# Patient Record
Sex: Female | Born: 1935 | Race: White | Marital: Single | Smoking: Never smoker
Health system: Southern US, Community
[De-identification: ages and names within clinical notes are randomized; demographics above are authoritative.]

## PROBLEM LIST (undated history)

## (undated) DIAGNOSIS — I10 Essential (primary) hypertension: Secondary | ICD-10-CM

## (undated) DIAGNOSIS — F0390 Unspecified dementia without behavioral disturbance: Secondary | ICD-10-CM

## (undated) DIAGNOSIS — Z9289 Personal history of other medical treatment: Secondary | ICD-10-CM

## (undated) DIAGNOSIS — E119 Type 2 diabetes mellitus without complications: Secondary | ICD-10-CM

## (undated) DIAGNOSIS — I482 Chronic atrial fibrillation, unspecified: Secondary | ICD-10-CM

## (undated) DIAGNOSIS — M1990 Unspecified osteoarthritis, unspecified site: Secondary | ICD-10-CM

## (undated) HISTORY — PX: FRACTURE SURGERY: SHX138

## (undated) HISTORY — PX: CARDIAC CATHETERIZATION: SHX172

---

## 2011-04-16 ENCOUNTER — Emergency Department: Payer: Self-pay | Admitting: Unknown Physician Specialty

## 2012-09-13 ENCOUNTER — Inpatient Hospital Stay: Payer: Self-pay | Admitting: Orthopedic Surgery

## 2012-09-13 LAB — CK TOTAL AND CKMB (NOT AT ARMC)
CK, Total: 45 U/L (ref 21–215)
CK-MB: 0.7 ng/mL (ref 0.5–3.6)

## 2012-09-13 LAB — BASIC METABOLIC PANEL
BUN: 26 mg/dL — ABNORMAL HIGH (ref 7–18)
Chloride: 100 mmol/L (ref 98–107)
Co2: 30 mmol/L (ref 21–32)
Creatinine: 1.35 mg/dL — ABNORMAL HIGH (ref 0.60–1.30)
EGFR (African American): 43 — ABNORMAL LOW
EGFR (Non-African Amer.): 38 — ABNORMAL LOW
Glucose: 98 mg/dL (ref 65–99)
Osmolality: 280 (ref 275–301)
Sodium: 138 mmol/L (ref 136–145)

## 2012-09-13 LAB — URINALYSIS, COMPLETE
Bacteria: NONE SEEN
Glucose,UR: NEGATIVE mg/dL (ref 0–75)
Ketone: NEGATIVE
Leukocyte Esterase: NEGATIVE
Nitrite: NEGATIVE
Ph: 6 (ref 4.5–8.0)
Protein: NEGATIVE
Specific Gravity: 1.01 (ref 1.003–1.030)

## 2012-09-13 LAB — PROTIME-INR
INR: 2.2
Prothrombin Time: 24.3 secs — ABNORMAL HIGH (ref 11.5–14.7)

## 2012-09-13 LAB — CBC WITH DIFFERENTIAL/PLATELET
Basophil #: 0.1 10*3/uL (ref 0.0–0.1)
Basophil %: 0.3 %
Eosinophil #: 0.1 10*3/uL (ref 0.0–0.7)
HCT: 35 % (ref 35.0–47.0)
HGB: 11.8 g/dL — ABNORMAL LOW (ref 12.0–16.0)
Lymphocyte #: 1 10*3/uL (ref 1.0–3.6)
MCH: 32.1 pg (ref 26.0–34.0)
MCHC: 33.7 g/dL (ref 32.0–36.0)
Monocyte #: 0.8 x10 3/mm (ref 0.2–0.9)
Monocyte %: 4.7 %
Neutrophil #: 15.1 10*3/uL — ABNORMAL HIGH (ref 1.4–6.5)
Platelet: 282 10*3/uL (ref 150–440)
RDW: 12.3 % (ref 11.5–14.5)

## 2012-09-13 LAB — APTT: Activated PTT: 37.2 secs — ABNORMAL HIGH (ref 23.6–35.9)

## 2012-09-13 LAB — MAGNESIUM: Magnesium: 1.7 mg/dL — ABNORMAL LOW

## 2012-09-14 DIAGNOSIS — I4891 Unspecified atrial fibrillation: Secondary | ICD-10-CM

## 2012-09-14 DIAGNOSIS — I059 Rheumatic mitral valve disease, unspecified: Secondary | ICD-10-CM

## 2012-09-14 LAB — PROTIME-INR: INR: 2.1

## 2012-09-15 DIAGNOSIS — I4891 Unspecified atrial fibrillation: Secondary | ICD-10-CM

## 2012-09-15 LAB — CBC WITH DIFFERENTIAL/PLATELET
Basophil #: 0 10*3/uL (ref 0.0–0.1)
Eosinophil #: 0.2 10*3/uL (ref 0.0–0.7)
Eosinophil %: 1.2 %
HGB: 10.7 g/dL — ABNORMAL LOW (ref 12.0–16.0)
Lymphocyte #: 1.4 10*3/uL (ref 1.0–3.6)
MCH: 32.9 pg (ref 26.0–34.0)
MCHC: 34.3 g/dL (ref 32.0–36.0)
MCV: 96 fL (ref 80–100)
Monocyte #: 1.6 x10 3/mm — ABNORMAL HIGH (ref 0.2–0.9)
Neutrophil %: 77.5 %
Platelet: 238 10*3/uL (ref 150–440)
RDW: 12.4 % (ref 11.5–14.5)

## 2012-09-15 LAB — BASIC METABOLIC PANEL
Anion Gap: 7 (ref 7–16)
Calcium, Total: 9.5 mg/dL (ref 8.5–10.1)
Co2: 26 mmol/L (ref 21–32)
Creatinine: 1.18 mg/dL (ref 0.60–1.30)
EGFR (African American): 51 — ABNORMAL LOW
Glucose: 127 mg/dL — ABNORMAL HIGH (ref 65–99)
Potassium: 4.5 mmol/L (ref 3.5–5.1)
Sodium: 138 mmol/L (ref 136–145)

## 2012-09-15 LAB — PROTIME-INR: INR: 1.2

## 2012-09-16 DIAGNOSIS — I4891 Unspecified atrial fibrillation: Secondary | ICD-10-CM

## 2012-09-16 LAB — BASIC METABOLIC PANEL
Anion Gap: 10 (ref 7–16)
Co2: 24 mmol/L (ref 21–32)
Creatinine: 1.07 mg/dL (ref 0.60–1.30)
EGFR (African American): 58 — ABNORMAL LOW
EGFR (Non-African Amer.): 50 — ABNORMAL LOW
Glucose: 96 mg/dL (ref 65–99)
Osmolality: 279 (ref 275–301)
Sodium: 138 mmol/L (ref 136–145)

## 2012-09-16 LAB — PLATELET COUNT: Platelet: 227 10*3/uL (ref 150–440)

## 2012-09-16 LAB — HEMOGLOBIN: HGB: 9.9 g/dL — ABNORMAL LOW (ref 12.0–16.0)

## 2012-09-17 LAB — URINALYSIS, COMPLETE
Glucose,UR: 50 mg/dL (ref 0–75)
Hyaline Cast: 1
Nitrite: NEGATIVE
Protein: 30
Specific Gravity: 1.014 (ref 1.003–1.030)
Transitional Epi: 1
WBC UR: 10 /HPF (ref 0–5)

## 2012-09-18 LAB — PROTIME-INR
INR: 1.6
Prothrombin Time: 19.1 secs — ABNORMAL HIGH (ref 11.5–14.7)

## 2012-09-19 DIAGNOSIS — I4891 Unspecified atrial fibrillation: Secondary | ICD-10-CM

## 2012-09-20 ENCOUNTER — Encounter: Payer: Self-pay | Admitting: Internal Medicine

## 2012-09-20 LAB — PROTIME-INR
INR: 3.7
Prothrombin Time: 36.7 secs — ABNORMAL HIGH (ref 11.5–14.7)

## 2012-09-22 LAB — PROTIME-INR: INR: 4.1

## 2012-09-23 ENCOUNTER — Emergency Department: Payer: Self-pay | Admitting: Emergency Medicine

## 2012-09-23 LAB — CBC
HCT: 30.1 % — ABNORMAL LOW (ref 35.0–47.0)
HGB: 10 g/dL — ABNORMAL LOW (ref 12.0–16.0)
MCHC: 33.4 g/dL (ref 32.0–36.0)
MCV: 97 fL (ref 80–100)
RBC: 3.11 10*6/uL — ABNORMAL LOW (ref 3.80–5.20)
RDW: 12.9 % (ref 11.5–14.5)
WBC: 15 10*3/uL — ABNORMAL HIGH (ref 3.6–11.0)

## 2012-09-23 LAB — DIGOXIN LEVEL: Digoxin: 1.7 ng/mL

## 2012-09-23 LAB — COMPREHENSIVE METABOLIC PANEL
Alkaline Phosphatase: 60 U/L (ref 50–136)
Anion Gap: 4 — ABNORMAL LOW (ref 7–16)
BUN: 20 mg/dL — ABNORMAL HIGH (ref 7–18)
Bilirubin,Total: 0.5 mg/dL (ref 0.2–1.0)
Calcium, Total: 9.1 mg/dL (ref 8.5–10.1)
Chloride: 106 mmol/L (ref 98–107)
Co2: 27 mmol/L (ref 21–32)
Creatinine: 1.12 mg/dL (ref 0.60–1.30)
EGFR (African American): 54 — ABNORMAL LOW
EGFR (Non-African Amer.): 47 — ABNORMAL LOW
Glucose: 80 mg/dL (ref 65–99)
Osmolality: 275 (ref 275–301)
Potassium: 4.7 mmol/L (ref 3.5–5.1)
SGOT(AST): 25 U/L (ref 15–37)
SGPT (ALT): 13 U/L (ref 12–78)
Sodium: 137 mmol/L (ref 136–145)
Total Protein: 6 g/dL — ABNORMAL LOW (ref 6.4–8.2)

## 2012-09-23 LAB — CK TOTAL AND CKMB (NOT AT ARMC): CK, Total: 28 U/L (ref 21–215)

## 2012-09-23 LAB — PROTIME-INR
INR: 4.2
Prothrombin Time: 40.6 secs — ABNORMAL HIGH (ref 11.5–14.7)

## 2012-09-23 LAB — TROPONIN I: Troponin-I: <0.02 ng/mL

## 2012-09-26 LAB — PROTIME-INR
INR: 2.7
Prothrombin Time: 29 secs — ABNORMAL HIGH (ref 11.5–14.7)

## 2012-09-29 LAB — PROTIME-INR: INR: 3.7

## 2012-10-03 LAB — PROTIME-INR: Prothrombin Time: 31.1 secs — ABNORMAL HIGH (ref 11.5–14.7)

## 2012-10-09 ENCOUNTER — Encounter: Payer: Self-pay | Admitting: Internal Medicine

## 2012-10-11 LAB — PROTIME-INR: INR: 5.7

## 2012-10-12 LAB — URINALYSIS, COMPLETE
Bacteria: NONE SEEN
Bilirubin,UR: NEGATIVE
Blood: NEGATIVE
Glucose,UR: 50 mg/dL (ref 0–75)
Leukocyte Esterase: NEGATIVE
Ph: 7 (ref 4.5–8.0)
Specific Gravity: 1.012 (ref 1.003–1.030)
Squamous Epithelial: <1

## 2012-10-13 ENCOUNTER — Emergency Department: Payer: Self-pay | Admitting: Internal Medicine

## 2012-10-13 ENCOUNTER — Ambulatory Visit: Payer: Self-pay

## 2012-10-13 LAB — PROTIME-INR
INR: 4.3
Prothrombin Time: 41.3 secs — ABNORMAL HIGH (ref 11.5–14.7)

## 2012-10-13 LAB — URINE CULTURE

## 2012-10-17 LAB — PROTIME-INR: INR: 1.8

## 2012-10-18 LAB — COMPREHENSIVE METABOLIC PANEL
Alkaline Phosphatase: 105 U/L (ref 50–136)
Bilirubin,Total: 0.6 mg/dL (ref 0.2–1.0)
Calcium, Total: 9.2 mg/dL (ref 8.5–10.1)
Chloride: 102 mmol/L (ref 98–107)
Co2: 30 mmol/L (ref 21–32)
EGFR (African American): >60
EGFR (Non-African Amer.): 57 — ABNORMAL LOW
Glucose: 114 mg/dL — ABNORMAL HIGH (ref 65–99)
Osmolality: 280 (ref 275–301)
SGOT(AST): 19 U/L (ref 15–37)
SGPT (ALT): 18 U/L (ref 12–78)
Total Protein: 6.2 g/dL — ABNORMAL LOW (ref 6.4–8.2)

## 2012-10-18 LAB — CBC WITH DIFFERENTIAL/PLATELET
Basophil #: 0 10*3/uL (ref 0.0–0.1)
Eosinophil #: 0.1 10*3/uL (ref 0.0–0.7)
HCT: 33.5 % — ABNORMAL LOW (ref 35.0–47.0)
Lymphocyte #: 2.1 10*3/uL (ref 1.0–3.6)
MCH: 32.3 pg (ref 26.0–34.0)
MCHC: 34.1 g/dL (ref 32.0–36.0)
MCV: 95 fL (ref 80–100)
Monocyte #: 1.2 x10 3/mm — ABNORMAL HIGH (ref 0.2–0.9)
Neutrophil #: 6.5 10*3/uL (ref 1.4–6.5)
RBC: 3.53 10*6/uL — ABNORMAL LOW (ref 3.80–5.20)
RDW: 13.7 % (ref 11.5–14.5)

## 2012-10-18 LAB — URIC ACID: Uric Acid: 3.6 mg/dL (ref 2.6–6.0)

## 2012-10-25 LAB — PROTIME-INR
INR: 1.5
Prothrombin Time: 18.2 secs — ABNORMAL HIGH (ref 11.5–14.7)

## 2012-11-01 ENCOUNTER — Emergency Department: Payer: Self-pay | Admitting: Emergency Medicine

## 2012-11-01 LAB — PROTIME-INR: INR: 1.9

## 2012-11-01 LAB — BASIC METABOLIC PANEL
Anion Gap: 6 — ABNORMAL LOW (ref 7–16)
BUN: 13 mg/dL (ref 7–18)
Calcium, Total: 9.5 mg/dL (ref 8.5–10.1)
Chloride: 103 mmol/L (ref 98–107)
Co2: 27 mmol/L (ref 21–32)
Creatinine: 0.75 mg/dL (ref 0.60–1.30)
EGFR (African American): >60
EGFR (Non-African Amer.): >60
Potassium: 4.4 mmol/L (ref 3.5–5.1)

## 2012-11-01 LAB — CK TOTAL AND CKMB (NOT AT ARMC): CK-MB: <0.5 ng/mL — ABNORMAL LOW (ref 0.5–3.6)

## 2012-11-01 LAB — CBC
MCV: 93 fL (ref 80–100)
Platelet: 347 10*3/uL (ref 150–440)
RDW: 13.9 % (ref 11.5–14.5)
WBC: 8.4 10*3/uL (ref 3.6–11.0)

## 2012-11-01 LAB — TROPONIN I
Troponin-I: <0.02 ng/mL
Troponin-I: <0.02 ng/mL

## 2012-11-03 ENCOUNTER — Telehealth: Payer: Self-pay

## 2012-11-03 NOTE — Telephone Encounter (Signed)
Per family member, pt not at home Lives at [facility]

## 2012-11-03 NOTE — Telephone Encounter (Signed)
"  call patient to check on her. Was in ER for atrial fib. New patient to [place]" VO Dr. Alvis Lemmings, RN

## 2012-11-04 NOTE — Telephone Encounter (Signed)
LMTCB at [facility]

## 2012-11-09 ENCOUNTER — Encounter: Payer: Self-pay | Admitting: Internal Medicine

## 2013-06-24 ENCOUNTER — Emergency Department: Payer: Self-pay | Admitting: Internal Medicine

## 2013-06-24 LAB — COMPREHENSIVE METABOLIC PANEL
Alkaline Phosphatase: 85 U/L (ref 50–136)
Bilirubin,Total: 0.4 mg/dL (ref 0.2–1.0)
Calcium, Total: 9.4 mg/dL (ref 8.5–10.1)
Chloride: 107 mmol/L (ref 98–107)
Co2: 26 mmol/L (ref 21–32)
EGFR (African American): >60
EGFR (Non-African Amer.): >60
SGPT (ALT): 18 U/L (ref 12–78)
Sodium: 138 mmol/L (ref 136–145)
Total Protein: 7 g/dL (ref 6.4–8.2)

## 2013-06-24 LAB — CBC
HCT: 38.3 % (ref 35.0–47.0)
MCH: 31.9 pg (ref 26.0–34.0)
MCHC: 34.7 g/dL (ref 32.0–36.0)
Platelet: 288 10*3/uL (ref 150–440)
RDW: 12.4 % (ref 11.5–14.5)

## 2013-08-10 IMAGING — CR DG CHEST 1V PORT
1 series · 1 of 1 positions shown · non-contrast
Comparison: none

REASON FOR EXAM: Chest Pain
COMMENTS:

PROCEDURE:     DXR - DXR PORTABLE CHEST SINGLE VIEW  - November 01, 2012  [DATE]
RESULT:     Comparison: 09/23/2012

[ap]
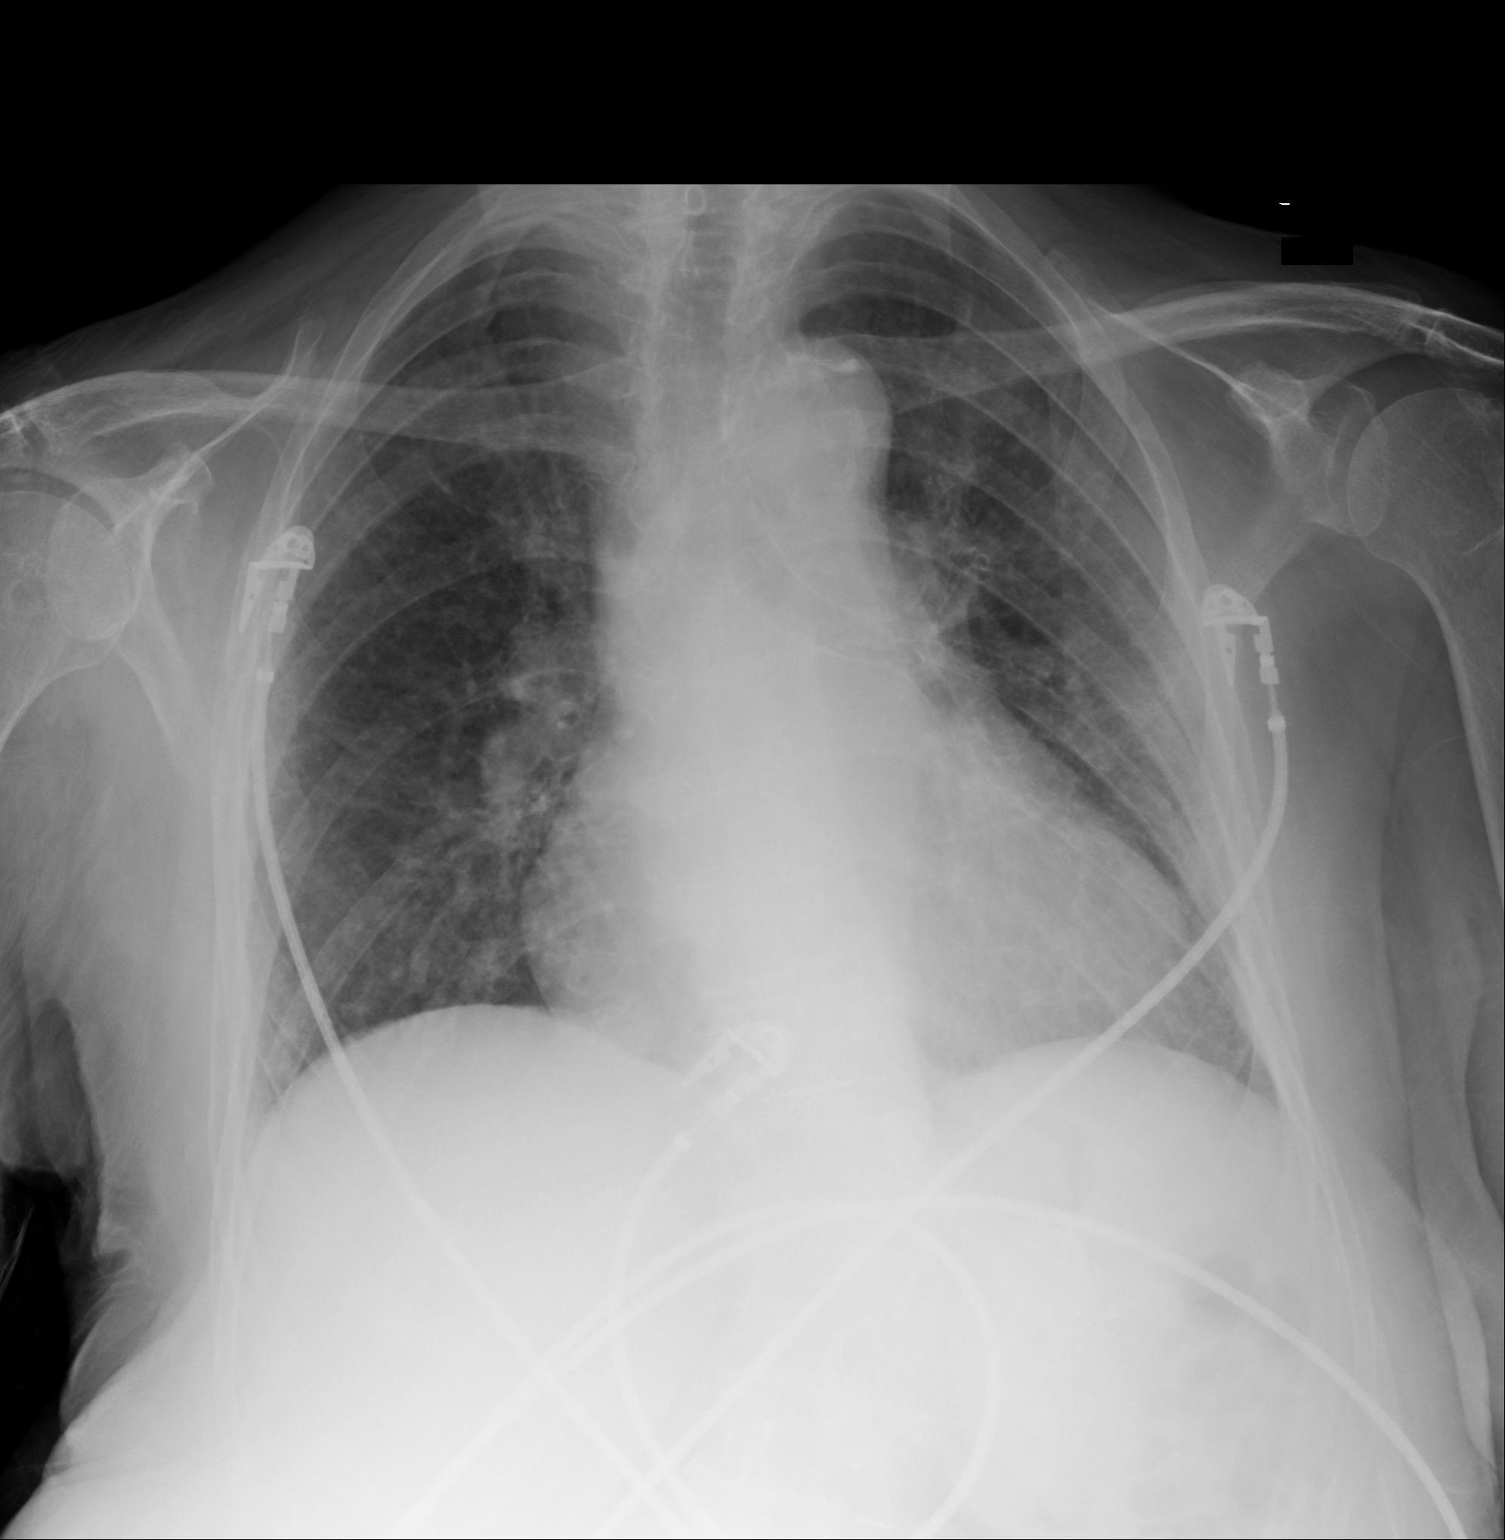

[1 of 1 positions shown; findings below may reference images not displayed]

FINDINGS: Cardiomegaly and the mediastinum are similar to prior. Mild hilar prominence
is likely related to prominent central pulmonary vasculature. There are
bilateral interstitial pulmonary opacities.
IMPRESSION: Mild interstitial opacities a may be secondary to interstitial pulmonary
edema. Atypical infection could have a similar appearance.

[REDACTED]

## 2015-02-26 NOTE — Consult Note (Signed)
General Aspect 81 yo woman with PMH of atrial fibrillation, on warfarin, presenting with fall and left hip fracture. In ER noted to have atrial fib with RVR. cardiology was consulted for preop eval and atrial fib with RVR.  She reports being in good health prior to her fall. She went to check her dog and turned to pivot and she fell. She felll on  her left hip. Following the fall, she presented to the ER.  She reports having atrial fibrillation for many years. She is seen in [place] for her medical and specialist care. Gait intability at baseline.    Present Illness . social: Lives with garndson, no smoking. unsteady gait at baseline  Family Hx: No CAD hx  by patients report   Physical Exam:   GEN well developed, well nourished, no acute distress, thin    HEENT red conjunctivae    NECK supple  No masses    RESP normal resp effort  clear BS    CARD Irregular rate and rhythm  Murmur    Murmur Systolic    ABD denies tenderness  soft    LYMPH negative neck    EXTR negative edema    SKIN normal to palpation    NEURO motor/sensory function intact    PSYCH alert, A+O to time, place, person, good insight   Review of Systems:   Subjective/Chief Complaint Left hip pain after fall     Left Hip Fracture:    Diabetes:    Hypertension:   Home Medications: Medication Instructions Status  polyethylene glycol 3350 oral powder for reconstitution 17 gram(s) (1 capful) mixed in 8 ounces of water, juice, or other liquid orally once a day as directed for constipation Active  tramadol 50 mg oral tablet 1 tab(s) orally every 4 hours for pain Active  warfarin 5 mg oral tablet 1 tab(s) orally once a day at 5 pm or as directed Active  enalapril 2.5 mg oral tablet 1 tab(s) orally once a day (in the morning) for blood pressure Active  metformin 500 mg oral tablet 1 tab(s) orally once a day for blood sugar Active  ferrous sulfate 325 mg (65 mg elemental iron) oral tablet 1 tab(s)  orally 3 times a day Active  calcium-vitamin D 600 mg-400 intl units oral tablet, chewable 1 tab(s) orally 2 times a day Active  Diltiazem Hydrochloride CD 240 mg/24 hours oral capsule, extended release 1 cap(s) orally once a day Active  citalopram 20 mg oral tablet 1 tab(s) orally once a day for depression Active  Vitamin B-12 1000 mcg oral tablet 1 tab(s) orally once a day Active  Vitamin D3 1000 intl units oral tablet 1 tab(s) orally once a day for supplement Active  simvastatin 20 mg oral tablet 1 tab(s) orally once a day (at bedtime) for cholesterol Active  gabapentin 300 mg oral capsule 1 cap(s) orally 3 times a day Active  furosemide 20 mg oral tablet 1 tab(s) orally once a day for edema/BP Active   Lab Results:  Thyroid:  05-Nov-13 15:35    Thyroid Stimulating Hormone 1.97 (0.45-4.50 (International Unit)  ----------------------- Pregnant patients have  different reference  ranges for TSH:  - - - - - - - - - -  Pregnant, first trimetser:  0.36 - 2.50 uIU/mL)  Routine BB:  05-Nov-13 15:35    ABO Group + Rh Type A Positive   Antibody Screen NEGATIVE (Result(s) reported on 13 Sep 2012 at 04:35PM.)  Routine Chem:  05-Nov-13  15:35    Magnesium, Serum  1.7 (1.8-2.4 THERAPEUTIC RANGE: 4-7 mg/dL TOXIC: > 10 mg/dL  -----------------------)   Glucose, Serum 98   BUN  26   Creatinine (comp)  1.35   Sodium, Serum 138   Potassium, Serum 3.9   Chloride, Serum 100   CO2, Serum 30   Calcium (Total), Serum  10.4   Anion Gap 8   Osmolality (calc) 280   eGFR (African American)  43   eGFR (Non-African American)  38 (eGFR values <37m/min/1.73 m2 may be an indication of chronic kidney disease (CKD). Calculated eGFR is useful in patients with stable renal function. The eGFR calculation will not be reliable in acutely ill patients when serum creatinine is changing rapidly. It is not useful in  patients on dialysis. The eGFR calculation may not be applicable to patients at the low and  high extremes of body sizes, pregnant women, and vegetarians.)  Cardiac:  05-Nov-13 15:35    Troponin I < 0.02 (0.00-0.05 0.05 ng/mL or less: NEGATIVE  Repeat testing in 3-6 hrs  if clinically indicated. >0.05 ng/mL: POTENTIAL  MYOCARDIAL INJURY. Repeat  testing in 3-6 hrs if  clinically indicated. NOTE: An increase or decrease  of 30% or more on serial  testing suggests a  clinically important change)   CK, Total 45   CPK-MB, Serum 0.7 (Result(s) reported on 13 Sep 2012 at 04:32PM.)  Routine UA:  05-Nov-13 15:35    Color (UA) Straw   Clarity (UA) Clear   Glucose (UA) Negative   Bilirubin (UA) Negative   Ketones (UA) Negative   Specific Gravity (UA) 1.010   Blood (UA) 1+   pH (UA) 6.0   Protein (UA) Negative   Nitrite (UA) Negative   Leukocyte Esterase (UA) Negative (Result(s) reported on 13 Sep 2012 at 04:33PM.)   RBC (UA) 1 /HPF   WBC (UA) 1 /HPF   Bacteria (UA) NONE SEEN   Epithelial Cells (UA) <1 /HPF   Hyaline Cast (UA) 3 /LPF (Result(s) reported on 13 Sep 2012 at 04:33PM.)  Routine Coag:  05-Nov-13 15:35    Prothrombin  24.3   INR 2.2 (INR reference interval applies to patients on anticoagulant therapy. A single INR therapeutic range for coumarins is not optimal for all indications; however, the suggested range for most indications is 2.0 - 3.0. Exceptions to the INR Reference Range may include: Prosthetic heart valves, acute myocardial infarction, prevention of myocardial infarction, and combinations of aspirin and anticoagulant. The need for a higher or lower target INR must be assessed individually. Reference: The Pharmacology and Management of the Vitamin K  antagonists: the seventh ACCP Conference on Antithrombotic and Thrombolytic Therapy. CNTZGY.1749Sept:126 (3suppl): [id number] A HCT value >55% may artifactually increase the PT.  In one study,  the increase was an average of 25%. Reference:  "Effect on Routine and Special Coagulation Testing  Values of Citrate Anticoagulant Adjustment in Patients with High HCT Values." American Journal of Clinical Pathology 2006;126:400-405.)   Activated PTT (APTT)  37.2 (A HCT value >55% may artifactually increase the APTT. In one study, the increase was an average of 19%. Reference: "Effect on Routine and Special Coagulation Testing Values of Citrate Anticoagulant Adjustment in Patients with High HCT Values." American Journal of Clinical Pathology 2006;126:400-405.)  Routine Hem:  05-Nov-13 15:35    WBC (CBC)  17.0   RBC (CBC)  3.67   Hemoglobin (CBC)  11.8   Hematocrit (CBC) 35.0   Platelet Count (CBC) 282   MCV  95   MCH 32.1   MCHC 33.7   RDW 12.3   Neutrophil % 88.4   Lymphocyte % 5.9   Monocyte % 4.7   Eosinophil % 0.7   Basophil % 0.3   Neutrophil #  15.1   Lymphocyte # 1.0   Monocyte # 0.8   Eosinophil # 0.1   Basophil # 0.1 (Result(s) reported on 13 Sep 2012 at 04:07PM.)   EKG:   Interpretation EKG on arrival shows atrial fib with rate 138 bpm, poor R wave progression through anterior precordial leads, nonspecific ST ABN    Sulfa drugs: Itching  Vital Signs/Nurse's Notes: **Vital Signs.:   06-Nov-13 06:08   Vital Signs Type Q 4hr   Temperature Temperature (F) 97.9   Celsius 36.6   Temperature Source Oral   Pulse Pulse 77   Respirations Respirations 16   Systolic BP Systolic BP 740   Diastolic BP (mmHg) Diastolic BP (mmHg) 64   Mean BP 76   Pulse Ox % Pulse Ox % 99   Oxygen Delivery Room Air/ 21 %     Impression 81 yo woman with PMH of atrial fibrillation, on warfarin, presenting with fall and left hip fracture. In ER noted to have atrial fib with RVR. cardiology was consulted for preop eval and atrial fib with RVR.  1) Left hip fx: INR elevated >2, Vit K given today Ortho is recommending surgery tomorrow given bleeding risk. She appears comfortable --Acceptable risk for surgery. No known CAD by patients reports. No active angina.  Echo pending  2)  Atrial fibrillation: Chronic, on warfarin Elevated heart rate likely from trauma and pain,  rate improved overnight ---Would continue diltiazem 60 mg Q6 (or Q8 if BP drops) ---Will decrease IVF rate as she required lasix as an outpt for suspected chronic diastolic CHF (had LE edema in afternoons --warfarin on hold. --Following surgery, could use xarelto (or restart warfarin with lovenox bridge)  3)   Electronic Signatures: Ida Rogue (MD)  (Signed [id number] 08:27)  Authored: General Aspect/Present Illness, History and Physical Exam, Review of System, Past Medical History, Home Medications, Labs, EKG , Allergies, Vital Signs/Nurse's Notes, Impression/Plan   Last Updated: 06-Nov-13 08:27 by Ida Rogue (MD)

## 2015-02-26 NOTE — Consult Note (Signed)
PATIENT NAME:  Madison Montoya, Madison Montoya MR#:  [id number] DATE OF BIRTH:  Sep 29, 1932  DATE OF CONSULTATION:  09/13/2012  REFERRING PHYSICIAN:  Kennedy Bucker, MD CONSULTING PHYSICIAN:  Alexes Menchaca H. Allena Katz, MD  PRIMARY CARE PHYSICIAN: [facility].  REASON FOR CONSULTATION: Preop evaluation, atrial fibrillation with RVR, diabetes, and hypertension.   HISTORY OF PRESENT ILLNESS: The patient is a 81 year old white female who fell earlier today and sustained a left-sided hip fracture, is being admitted by Ortho and we are asked to see the patient preoperatively. The patient is a very poor historian. She was noted to be in atrial fibrillation with RVR here and did not recall if she had atrial fibrillation, but she is on Coumadin and then on further questioning she reports that she might have been told that she has atrial fibrillation. She reports that she was trying to go to her living room and fell. She is very vague about how she fell. She states that she did not pass out, but she states that she was walking and the next thing she knew she was on the floor. She does have a history of feeling dizzy but has not passed out. She denies any chest pain or shortness of breath, no dyspnea on exertion. She has been eating and drinking okay, denies any abdominal pain, nausea, vomiting, diarrhea, or any urinary symptoms.   PAST MEDICAL HISTORY:  1. Diabetes type 2.  2. Hypertension.  3. Hyperlipidemia.  4. Depression.  5. History of coronary artery disease with angioplasty.  6. History of atrial fibrillation.   PAST SURGICAL HISTORY: None.   ALLERGIES: Sulfa.   HOME MEDICATIONS:  1. Calcium plus vitamin D 1 tab p.o. twice a day. 2. Citalopram 20 mg daily.  3. Diltiazem 250 mg daily.  4. Enalapril 2.5 mg daily.  5. Iron sulfate 325 mg 1 tab p.o. three times daily. 6. Lasix 20 mg 1 tab p.o. daily.  7. Gabapentin 300 mg 1 tab p.o. three times daily. 8. Metformin 500 mg 1 tab p.o. daily.  9. MiraLax 17 grams  daily.  10. Simvastatin 20 mg at bedtime.  11. Tramadol 50 mg every four hours p.r.n. pain.  12. Vitamin B12 1000 mcg daily.  13. Vitamin D3 1000 international units daily.  14. Warfarin 5 mg 1 tab p.o. daily.   SOCIAL HISTORY: Does not smoke, does not drink; lives with her grandson.   REVIEW OF SYSTEMS: Very poor historian. CONSTITUTIONAL: Denies any fevers, fatigue, or weakness. Complains of pain in the left hip, but not significant. Denies any weight loss, weight gain. EYES: No blurred or double vision. No pain. No redness. ENT: Denies any tinnitus. No ear pain. No hearing loss. No seasonal or year-round allergies. No difficulty swallowing. RESPIRATORY: Denies any cough, wheezing, or hemoptysis. No chronic obstructive pulmonary disease or tuberculosis. CARDIOVASCULAR: Denies any chest pain, orthopnea, or edema. Has history of atrial fibrillation. Denies any syncope. GASTROINTESTINAL: Denies any nausea, vomiting, or diarrhea. No abdominal pain. No hematemesis. GU: Denies any dysuria, hematuria, renal calculus, or frequency. ENDOCRINE: Denies any polyuria, nocturia, or thyroid problems. HEME/LYMPH: Denies anemia, easy bruisability, or bleeding. SKIN: No acne, no rash, and no change in mole, hair, or skin. MUSCULOSKELETAL: Complains of pain in the left hip. NEUROLOGIC: Denies any CVA, transient ischemic attack, or seizures. PSYCHIATRIC: Denies any anxiety, insomnia, or ADD.   PHYSICAL EXAMINATION:  VITAL SIGNS: Temperature 98.6, pulse 118, respirations 22, blood pressure 148/89, and O2 95%.   GENERAL: The patient is a 81 year old white female  currently not in any acute distress.   HEENT: Head atraumatic, normocephalic. Pupils equally round and reactive to light and accommodation. There is no conjunctival pallor. No scleral icterus. Nasal exam shows no drainage or ulceration. Oropharynx is clear without any exudate.   NECK: No thyromegaly. No carotid bruits.   CARDIOVASCULAR: Irregular, irregular  rhythm. No murmurs, rubs, clicks, or gallops. PMI is not displaced.   PULMONARY: Lungs are clear to auscultation bilaterally without any rales, rhonchi, or wheezing.   ABDOMEN: Soft, nontender, and nondistended. Positive bowel sounds x4. There is no hepatosplenomegaly.   EXTREMITIES: No clubbing, cyanosis, or edema.   SKIN: No rash.   LYMPHATICS: No lymph nodes palpable.   VASCULAR: Good DP and PT pulses.   NEUROLOGICAL: Cranial nerves II through XII grossly intact. No focal deficits.   EVALUATIONS IN EMERGENCY DEPARTMENT: Glucose 98, BUN 26, creatinine 1.35, sodium 138, potassium 3.9, chloride 100, CO2 30, and calcium is elevated at 10.5. WBC count is 17.0, hemoglobin 11.8, and platelet count 282.   EKG showed atrial fibrillation with RVR. No ST-T wave changes.   ASSESSMENT AND PLAN: The patient is a 81 year old white female status post fall with left hip fracture.  1. Preop. The patient is currently in atrial fibrillation with RVR and will need heart rate controlled prior to surgery, also on Coumadin. INR is currently pending. Obviously her Coumadin will need to be held. We will see what her INR is. In light of the patient being a very poor historian and not aware of her atrial fibrillation, at this time we will get an echocardiogram of the heart, check a TSH. I will ask cardiology to come evaluate the patient preoperatively. We will also check TSH and magnesium. If her heart rate continues to be elevated (she is receiving one dose of Cardizem) and, if her heart does not come down, then might need Cardizem drip.  2. Diabetes. We will place her on sliding scale insulin for now, hold her glipizide.  3. Mildly elevated creatinine, possibly due to dehydration. We will give her IV fluids, follow her renal function.  4. Leukocytosis, likely stress reaction. Her urinalysis is currently pending. We will follow her urinalysis. Treated as needed. We will follow her CBC as well in the a.m.   5. Hyperlipidemia. Continue simvastatin.  6. Depression. Continue simvastatin.  7. Miscellaneous. Recommend incentive spirometry and deep vein thrombosis prophylaxis postoperative.  TIME SPENT:  35 minutes. ____________________________ Lacie ScottsShreyang H. Allena KatzPatel, MD shp:slb D: 09/13/2012 16:14:49 ET T: 09/14/2012 07:51:00 ET JOB#: [id number]  cc: Manie Bealer H. Allena KatzPatel, MD, <Dictator> Leitha SchullerMichael J. Menz, MD Charise CarwinSHREYANG H Azim Gillingham MD ELECTRONICALLY SIGNED 09/22/2012 13:28

## 2015-02-26 NOTE — Discharge Summary (Signed)
PATIENT NAME:  Madison Montoya, Madison MR#:  [id number] DATE OF BIRTH:  04/17/1932  DATE OF ADMISSION:  09/13/2012 DATE OF DISCHARGE:  09/19/2012  ADMITTING DIAGNOSIS: Left intertrochanteric hip fracture.   DISCHARGE DIAGNOSIS: Left intertrochanteric hip fracture.   PROCEDURE: ORIF of left intertrochanteric hip fracture.   SURGEON: Leitha SchullerMichael J. Menz, M.D.   ANESTHESIA: Spinal.   COMPLICATIONS: None.   SPECIMENS: None.   IMPLANTS: [employer]omet Affixus left 130 degrees, 360 x 11 mm Affixus rod with 100 mm lag screw.   HISTORY: Patient is a 81 year old female who has a history of poor ambulation. She had began using a cane at times, does not get out of her home much, and has been fearful of falls. She fell at home. She does not know exactly why. She has had some trouble with being unsteady. She was brought to the ER where she was found to have a left intertrochanteric hip fracture and she is being admitted for treatment of this.   PHYSICAL EXAMINATION: GENERAL: Slender white female who appears her stated age in mild distress. HEENT: Unremarkable. PULMONARY: Lungs are clear. CARDIOVASCULAR:  Heart has irregular rate and rhythm. ABDOMEN: Soft and nontender with positive bowel sounds. EXTREMITIES: The left lower extremity was shortened and externally rotated. She is able to flex and extend the toes with intact sensation. She does have a palpable pulse.   HOSPITAL COURSE: Patient was admitted to the hospital on 09/13/2012. She was brought from the ER to the orthopedic floor for a left intertrochanteric hip fracture. Patient was in atrial fibrillation and had a cardiology evaluate with echo. On 09/14/2012 due to INR elevation she was given vitamin K. She became tachycardic and again continued with atrial fibrillation and diltiazem was started. Patient also suffered some acute renal failure with elevated creatinine which was treated with gentle fluids. On 09/15/2012 heart rate improved with Cardizem, amiodarone and  digoxin as needed. She was cleared for surgery. She had surgery this day. She was then transferred back to the Critical Care Unit due to heart rate and low blood pressure with rapid atrial fibrillation. On 09/16/2012 patient was treated with  for low blood pressure and rapid atrial fibrillation. Cardizem was also restarted. She was also started to bridge from Lovenox to Coumadin. On 09/16/2012 patient also became stable. She was transferred back to the orthopedic floor. She remained stable. On 09/17/2012 atrial fibrillation rate was controlled with metoprolol only. Cardizem was discontinued and she was weaned off the neosynphrine. Patient remained rate controlled overnight. On 09/18/2012 Coumadin was resumed. INR was therapeutic. Patient continued to have slow progress throughout her stay. She was also found to have on 09/18/2012 some clue cells of the vaginal swab and a urinary tract infection. Patient was treated with metronidazole and Keflex for this. On 09/19/2012 patient was stable and ready for discharge to rehab.   DISCHARGE INSTRUCTIONS: Patient should wear TED hose thigh-high on bilateral lower extremities. Physical therapy should evaluate and treat for difficulty walking and she is left weight-bearing as tolerated. OT should evaluate and treat patient for muscle weakness and assistance with activities of daily living. She can resume a regular diet. On 09/29/2012 she should have staples removed. Incision should be cleaned with Betadine and benzoin and Steri-Strips should be applied. Should keep dressing clean and dry and change the dressing as needed. She should [facility] and her INR should be checked daily and she can resume Coumadin once INR below 3. Coumadin should be  held until INR below 3.   DISCHARGE MEDICATIONS:  1. Simvastatin 20 mg oral at bedtime.  2. Insulin SS NovoLog injection subcutaneous FSBS before meals and at bedtime. Give no  insulin if FSBS 0 to 150, 2 units if FSBS 151 to 200, 4 units if FSBS 201 to 250, 6 units if FSBS 251 to 300, 8 units if FSBS 301 to 350, 10 units if FSBS 351 to 400. Call M.D. if glucose greater than 400.  3. Diphenhydramine oral 25 mg oral every six hours p.r.n. itching or allergic reaction.  4. Hydroxyzine 25 mg oral q.i.d. p.r.n. itching.  5. Tramadol 50 mg oral every six hours p.r.n. pain. 6. Amiodarone 200 mg oral q.8 hours. 7. Gabapentin 300 mg oral t.i.d.  8. Senokot 50/8.6 mg tablet 1 tablet oral b.i.d.  9. Milk of magnesia 30 mL oral b.i.d. p.r.n. constipation.  10. Digoxin 0.125 mg oral daily. 11. Metronidazole 500 mg oral q.12 hours x7 days. 12. Keflex 500 mg oral q.12 hours x7 days.  13. Tylenol 650 mg oral every six hours p.r.n. pain or fever greater than 101.1.  14. Metoprolol tartrate tablet 50 mg oral q.12 hours, hold for SBP less than 110 or heart rate less than 60.  15. Coumadin 10 mg oral q. 5:00 p.m., hold until INR less than 3.  ____________________________ Evon Slack, PA-C tcg:cms D: 09/19/2012 12:35:58 ET T: 09/19/2012 13:34:12 ET JOB#: [id number]  cc: Evon Slack, PA-C, <Dictator> Evon Slack [place] ELECTRONICALLY SIGNED 09/20/2012 9:29

## 2015-02-26 NOTE — H&P (Signed)
PATIENT NAME:  Madison EdwardsSLAUGHTER, Moncia MR#:  [id number] DATE OF BIRTH:  1932-10-24  DATE OF ADMISSION:  09/13/2012  CHIEF COMPLAINT: Left hip pain.   HISTORY OF PRESENT ILLNESS: The patient is a 81 year old who has history of poor ambulation. She has been using a cane at times, does not get out of her home much, and has been fearful of falls. She fell at home. She does not know exactly why. She has had some trouble with being unsteady. She was brought to the Emergency Room where she was found to have a left intertrochanteric hip fracture and she is being admitted for treatment of this.   PAST MEDICAL HISTORY:  1. Atrial fibrillation. 2. Hypertension. 3. Non-insulin-dependent diabetes. 4. Depression.   SOCIAL HISTORY: She is a nondrinker, nonsmoker and lives at home but relies on her family to help her with some activities of daily living such as shopping.   ADMISSION MEDICATIONS: 1. Coumadin 5 mg a day.  2. Vitamin D3 1,000 units daily.  3. Vitamin B12 1000 mcg daily.  4. Tramadol one p.o. every 4 hours p.r.n. pain.  5. Simvastatin 20 mg at night. 6. Polyethylene glycol 17 grams daily for constipation.  7. Metformin 500 mg daily.  8. Gabapentin 300 mg three times daily. 9. Lasix 20 mg as needed for edema. 10. Enalapril 2.5 mg daily for hypertension. 11. Diltiazem CD 240 mg daily. 12. Iron supplementation daily.  13. Citalopram 20 mg daily.  14. Calcium with vitamin D twice a day.  REVIEW OF SYSTEMS: Positive just for the hip pain. She does not complain of having any other complaints at present. She denies loss of consciousness or other injury.   PHYSICAL EXAMINATION:   GENERAL: Slender, white female who appears her stated age in mild distress.   HEENT: Unremarkable.   PULMONARY: Lungs are clear.   CARDIOVASCULAR: Heart has irregular rate and rhythm.   ABDOMEN: Soft and nontender with positive bowel sounds.   EXTREMITIES: The left lower extremity is shortened and externally  rotated. She is able to flex and extend the toes with intact sensation. She does have a palpable pulse.  RADIOGRAPHS: X-rays reveal a minimally displaced intertrochanteric left hip fracture without comminution.   CLINICAL IMPRESSION: Left intertrochanteric hip fracture. The patient has significant cardiac issues. We will get preop medical evaluation, but with her Coumadin it may be several days before she can have her operative intervention. This was explained to her and her family and we will plan on surgery when appropriate medically with intramedullary device. I did discuss that she should be weight-bearing as tolerated postoperatively, but will probably require prolonged rehab stay, but the expectation is that she will be able to make it back home six weeks post discharge.  ____________________________ Leitha SchullerMichael J. Xzander Gilham, MD mjm:slb D: 09/14/2012 07:42:47 ET     T: 09/14/2012 08:01:30 ET        JOB#: [id number] cc: Leitha SchullerMichael J. Renee Beale, MD, <Dictator> Leitha SchullerMICHAEL J Obaloluwa Delatte MD ELECTRONICALLY SIGNED 09/14/2012 12:58

## 2015-02-26 NOTE — Op Note (Signed)
PATIENT NAME:  Madison EdwardsSLAUGHTER, Asra MR#:  [id number] DATE OF BIRTH:  08/11/1932  DATE OF PROCEDURE:  09/15/2012  PREOPERATIVE DIAGNOSIS: Left intertrochanteric hip fracture.   POSTOPERATIVE DIAGNOSIS: Left intertrochanteric hip fracture.   PROCEDURE: Open reduction and internal fixation of left intertrochanteric hip fracture.   SURGEON: Leitha SchullerMichael J. Ottis Vacha, MD  ANESTHESIA: Spinal.   DESCRIPTION OF PROCEDURE: The patient was brought to the Operating Room and after adequate anesthesia was obtained the left leg was placed in the traction boot, longitudinal traction applied. C-arm was brought in with the right leg in the well legholder and acceptable alignment was obtained. There was some shortening compared to initial ER film. After prepping and draping using the barrier drape method, appropriate patient identification and time out procedures were completed. A proximal skin incision was made and the subcutaneous tissue was spread with a hemostat. A guidewire was inserted into the greater trochanter, center position, and advanced, after checking on AP and lateral projections. Proximal reaming was carried out and a guidewire was then inserted down the canal with measurements taken off of this. Reaming was carried out with a 13 mm flexible reamer, over the guidewire. The appropriate rod length was 11 x 360 mm, Affixus left rod 11 mm diameter, 130 degrees. After this was impacted to the appropriate depth, with the alignment guide, a small lateral incision was made and subcutaneous tissue was spread. A guidewire was inserted into near center, center position slightly anterior on the lateral view, for the lag screw. This measured 100 mm. This was drilled and then a 100 mm lag screw inserted. The set screw was then tightened and released a quarter turn to allow for compression with weight-bearing. Permanent C-arm views were obtained after removal of the assembly handle. The wounds were irrigated. The deep fascia was  closed using a #1 Vicryl, #2-0 Vicryl subcutaneously, and skin staples. Xeroform, 4 x 4's, ABD, and tape were applied. The patient was sent to the recovery room in stable condition.   ESTIMATED BLOOD LOSS: 100 mL.   COMPLICATIONS: None.   SPECIMEN: None.   IMPLANT: Biomet Affixus left 130 degree, 360 x 11 mm Affixus rod with 100 mm lag screw.  ____________________________ Leitha SchullerMichael J. Benjamim Harnish, MD mjm:slb D: 09/15/2012 22:06:56 ET T: 09/16/2012 10:20:45 ET JOB#: [id number]  cc: Leitha SchullerMichael J. Nayomi Tabron, MD, <Dictator> Leitha SchullerMICHAEL J Valori Hollenkamp MD ELECTRONICALLY SIGNED 09/16/2012 12:32

## 2015-02-26 NOTE — Op Note (Signed)
PATIENT NAME:  Madison EdwardsSLAUGHTER, Myka MR#:  [id number] DATE OF BIRTH:  [birth year]  DATE OF PROCEDURE:  09/15/2012    ____________________________ Leitha SchullerMichael J. Kaleeyah Cuffie, MD mjm:cms D: 09/15/2012 22:00:27 ET T: 09/16/2012 09:02:29 ET JOB#: [id number]  cc:

## 2015-03-16 ENCOUNTER — Encounter: Payer: Self-pay | Admitting: Emergency Medicine

## 2015-03-16 ENCOUNTER — Emergency Department: Payer: Medicare Other

## 2015-03-16 ENCOUNTER — Other Ambulatory Visit: Payer: Self-pay

## 2015-03-16 ENCOUNTER — Inpatient Hospital Stay
Admission: EM | Admit: 2015-03-16 | Discharge: 2015-03-25 | DRG: 308 | Disposition: A | Discharge status: Skilled Nursing Facility | Payer: Medicare Other | Attending: Internal Medicine | Admitting: Internal Medicine

## 2015-03-16 DIAGNOSIS — E119 Type 2 diabetes mellitus without complications: Secondary | ICD-10-CM | POA: Diagnosis present

## 2015-03-16 DIAGNOSIS — F0391 Unspecified dementia with behavioral disturbance: Secondary | ICD-10-CM | POA: Diagnosis present

## 2015-03-16 DIAGNOSIS — K5791 Diverticulosis of intestine, part unspecified, without perforation or abscess with bleeding: Secondary | ICD-10-CM | POA: Diagnosis present

## 2015-03-16 DIAGNOSIS — N390 Urinary tract infection, site not specified: Secondary | ICD-10-CM | POA: Diagnosis present

## 2015-03-16 DIAGNOSIS — K922 Gastrointestinal hemorrhage, unspecified: Secondary | ICD-10-CM | POA: Diagnosis not present

## 2015-03-16 DIAGNOSIS — D72829 Elevated white blood cell count, unspecified: Secondary | ICD-10-CM | POA: Diagnosis present

## 2015-03-16 DIAGNOSIS — Z792 Long term (current) use of antibiotics: Secondary | ICD-10-CM | POA: Diagnosis not present

## 2015-03-16 DIAGNOSIS — D62 Acute posthemorrhagic anemia: Secondary | ICD-10-CM | POA: Diagnosis present

## 2015-03-16 DIAGNOSIS — I4819 Other persistent atrial fibrillation: Secondary | ICD-10-CM

## 2015-03-16 DIAGNOSIS — I10 Essential (primary) hypertension: Secondary | ICD-10-CM | POA: Diagnosis present

## 2015-03-16 DIAGNOSIS — M1990 Unspecified osteoarthritis, unspecified site: Secondary | ICD-10-CM | POA: Diagnosis present

## 2015-03-16 DIAGNOSIS — R079 Chest pain, unspecified: Secondary | ICD-10-CM | POA: Diagnosis present

## 2015-03-16 DIAGNOSIS — Z7901 Long term (current) use of anticoagulants: Secondary | ICD-10-CM | POA: Diagnosis not present

## 2015-03-16 DIAGNOSIS — R58 Hemorrhage, not elsewhere classified: Secondary | ICD-10-CM

## 2015-03-16 DIAGNOSIS — D500 Iron deficiency anemia secondary to blood loss (chronic): Secondary | ICD-10-CM | POA: Diagnosis not present

## 2015-03-16 DIAGNOSIS — I481 Persistent atrial fibrillation: Principal | ICD-10-CM | POA: Diagnosis present

## 2015-03-16 DIAGNOSIS — Z7982 Long term (current) use of aspirin: Secondary | ICD-10-CM

## 2015-03-16 DIAGNOSIS — I4891 Unspecified atrial fibrillation: Secondary | ICD-10-CM | POA: Diagnosis not present

## 2015-03-16 DIAGNOSIS — R0602 Shortness of breath: Secondary | ICD-10-CM | POA: Diagnosis present

## 2015-03-16 DIAGNOSIS — I959 Hypotension, unspecified: Secondary | ICD-10-CM | POA: Diagnosis not present

## 2015-03-16 DIAGNOSIS — Z515 Encounter for palliative care: Secondary | ICD-10-CM | POA: Diagnosis not present

## 2015-03-16 DIAGNOSIS — Z79899 Other long term (current) drug therapy: Secondary | ICD-10-CM

## 2015-03-16 DIAGNOSIS — I482 Chronic atrial fibrillation: Secondary | ICD-10-CM | POA: Diagnosis not present

## 2015-03-16 DIAGNOSIS — R4182 Altered mental status, unspecified: Secondary | ICD-10-CM | POA: Diagnosis not present

## 2015-03-16 HISTORY — DX: Essential (primary) hypertension: I10

## 2015-03-16 HISTORY — DX: Unspecified osteoarthritis, unspecified site: M19.90

## 2015-03-16 HISTORY — DX: Personal history of other medical treatment: Z92.89

## 2015-03-16 HISTORY — DX: Unspecified dementia, unspecified severity, without behavioral disturbance, psychotic disturbance, mood disturbance, and anxiety: F03.90

## 2015-03-16 HISTORY — DX: Type 2 diabetes mellitus without complications: E11.9

## 2015-03-16 LAB — CBC
HEMATOCRIT: 27.9 % — AB (ref 35.0–47.0)
Hemoglobin: 9.6 g/dL — ABNORMAL LOW (ref 12.0–16.0)
MCH: 32.6 pg (ref 26.0–34.0)
MCHC: 34.3 g/dL (ref 32.0–36.0)
MCV: 95.1 fL (ref 80.0–100.0)
Platelets: 289 10*3/uL (ref 150–440)
RBC: 2.93 MIL/uL — ABNORMAL LOW (ref 3.80–5.20)
RDW: 12.9 % (ref 11.5–14.5)
WBC: 9.8 10*3/uL (ref 3.6–11.0)

## 2015-03-16 LAB — COMPREHENSIVE METABOLIC PANEL
ALK PHOS: 42 U/L (ref 38–126)
ALT: 15 U/L (ref 14–54)
AST: 31 U/L (ref 15–41)
Albumin: 3.9 g/dL (ref 3.5–5.0)
Anion gap: 11 (ref 5–15)
BILIRUBIN TOTAL: 0.7 mg/dL (ref 0.3–1.2)
BUN: 21 mg/dL — ABNORMAL HIGH (ref 6–20)
CALCIUM: 10.1 mg/dL (ref 8.9–10.3)
CO2: 21 mmol/L — ABNORMAL LOW (ref 22–32)
Chloride: 106 mmol/L (ref 101–111)
Creatinine, Ser: 1.09 mg/dL — ABNORMAL HIGH (ref 0.44–1.00)
GFR calc Af Amer: 54 mL/min — ABNORMAL LOW (ref >60)
GFR, EST NON AFRICAN AMERICAN: 46 mL/min — AB (ref >60)
Glucose, Bld: 150 mg/dL — ABNORMAL HIGH (ref 65–99)
Potassium: 3.7 mmol/L (ref 3.5–5.1)
Sodium: 138 mmol/L (ref 135–145)
TOTAL PROTEIN: 6.5 g/dL (ref 6.5–8.1)

## 2015-03-16 LAB — CBC WITH DIFFERENTIAL/PLATELET
Basophils Absolute: 0 10*3/uL (ref 0–0.1)
Basophils Relative: 0 %
Eosinophils Absolute: 0.1 10*3/uL (ref 0–0.7)
Eosinophils Relative: 1 %
HEMATOCRIT: 33.4 % — AB (ref 35.0–47.0)
Hemoglobin: 11 g/dL — ABNORMAL LOW (ref 12.0–16.0)
Lymphocytes Relative: 22 %
Lymphs Abs: 2.6 10*3/uL (ref 1.0–3.6)
MCH: 31.6 pg (ref 26.0–34.0)
MCHC: 32.9 g/dL (ref 32.0–36.0)
MCV: 96.1 fL (ref 80.0–100.0)
MONO ABS: 1 10*3/uL — AB (ref 0.2–0.9)
Monocytes Relative: 9 %
NEUTROS ABS: 8.2 10*3/uL — AB (ref 1.4–6.5)
Neutrophils Relative %: 68 %
Platelets: 332 10*3/uL (ref 150–440)
RBC: 3.47 MIL/uL — ABNORMAL LOW (ref 3.80–5.20)
RDW: 13 % (ref 11.5–14.5)
WBC: 12 10*3/uL — ABNORMAL HIGH (ref 3.6–11.0)

## 2015-03-16 LAB — URINALYSIS COMPLETE WITH MICROSCOPIC (ARMC ONLY)
BILIRUBIN URINE: NEGATIVE
GLUCOSE, UA: NEGATIVE mg/dL
Ketones, ur: NEGATIVE mg/dL
Nitrite: NEGATIVE
Protein, ur: NEGATIVE mg/dL
SPECIFIC GRAVITY, URINE: 1.005 (ref 1.005–1.030)
pH: 7 (ref 5.0–8.0)

## 2015-03-16 LAB — PROTIME-INR
INR: 2.13
PROTHROMBIN TIME: 24 s — AB (ref 11.4–15.0)

## 2015-03-16 LAB — GLUCOSE, CAPILLARY
GLUCOSE-CAPILLARY: 120 mg/dL — AB (ref 70–99)
GLUCOSE-CAPILLARY: 112 mg/dL — AB (ref 70–99)

## 2015-03-16 LAB — TSH: TSH: 1.408 u[IU]/mL (ref 0.350–4.500)

## 2015-03-16 LAB — CREATININE, SERUM
Creatinine, Ser: 0.95 mg/dL (ref 0.44–1.00)
GFR calc non Af Amer: 55 mL/min — ABNORMAL LOW (ref >60)

## 2015-03-16 LAB — TROPONIN I
Troponin I: 0.04 ng/mL — ABNORMAL HIGH (ref <0.031)
Troponin I: 0.04 ng/mL — ABNORMAL HIGH (ref <0.031)

## 2015-03-16 LAB — MAGNESIUM: Magnesium: 1.7 mg/dL (ref 1.7–2.4)

## 2015-03-16 MED ORDER — ONDANSETRON HCL 4 MG PO TABS
4.0000 mg | ORAL_TABLET | Freq: Four times a day (QID) | ORAL | Status: DC | PRN
  Administered 2015-03-17: 4 mg via ORAL
  Filled 2015-03-16: qty 1

## 2015-03-16 MED ORDER — DILTIAZEM HCL 25 MG/5ML IV SOLN
5.0000 mg | Freq: Once | INTRAVENOUS | Status: AC
Start: 2015-03-16 — End: 2015-03-16
  Administered 2015-03-16: 5 mg via INTRAVENOUS

## 2015-03-16 MED ORDER — IOHEXOL 300 MG/ML  SOLN
80.0000 mL | Freq: Once | INTRAMUSCULAR | Status: AC | PRN
  Administered 2015-03-16: 80 mL via INTRAVENOUS

## 2015-03-16 MED ORDER — ACETAMINOPHEN 325 MG PO TABS
650.0000 mg | ORAL_TABLET | Freq: Four times a day (QID) | ORAL | Status: DC | PRN
  Administered 2015-03-17 – 2015-03-22 (×9): 650 mg via ORAL
  Filled 2015-03-16 (×10): qty 2

## 2015-03-16 MED ORDER — METOPROLOL TARTRATE 25 MG PO TABS
25.0000 mg | ORAL_TABLET | Freq: Two times a day (BID) | ORAL | Status: DC
  Administered 2015-03-16 – 2015-03-17 (×2): 25 mg via ORAL
  Filled 2015-03-16 (×3): qty 1

## 2015-03-16 MED ORDER — DILTIAZEM HCL 25 MG/5ML IV SOLN: 5.0000 mg | Freq: Once | INTRAVENOUS | Status: DC

## 2015-03-16 MED ORDER — ONDANSETRON HCL 4 MG/2ML IJ SOLN
4.0000 mg | Freq: Once | INTRAMUSCULAR | Status: AC
  Administered 2015-03-16: 4 mg via INTRAVENOUS

## 2015-03-16 MED ORDER — IOHEXOL 240 MG/ML SOLN
25.0000 mL | Freq: Once | INTRAMUSCULAR | Status: AC | PRN
  Administered 2015-03-16: 25 mL via INTRAVENOUS

## 2015-03-16 MED ORDER — DILTIAZEM HCL 100 MG IV SOLR
5.0000 mg/h | INTRAVENOUS | Status: DC
  Administered 2015-03-16 – 2015-03-17 (×2): 5 mg/h via INTRAVENOUS
  Filled 2015-03-16 (×3): qty 100

## 2015-03-16 MED ORDER — ACETAMINOPHEN 650 MG RE SUPP: 650.0000 mg | Freq: Four times a day (QID) | RECTAL | Status: DC | PRN

## 2015-03-16 MED ORDER — INSULIN ASPART 100 UNIT/ML ~~LOC~~ SOLN: 0.0000 [IU] | Freq: Three times a day (TID) | SUBCUTANEOUS | Status: DC

## 2015-03-16 MED ORDER — DILTIAZEM HCL 25 MG/5ML IV SOLN
INTRAVENOUS | Status: AC
  Filled 2015-03-16: qty 5

## 2015-03-16 MED ORDER — LORAZEPAM 2 MG/ML IJ SOLN
1.0000 mg | Freq: Once | INTRAMUSCULAR | Status: AC
  Administered 2015-03-16: 0.5 mg via INTRAVENOUS

## 2015-03-16 MED ORDER — CIPROFLOXACIN HCL 500 MG PO TABS
ORAL_TABLET | ORAL | Status: AC
  Filled 2015-03-16: qty 2

## 2015-03-16 MED ORDER — CALCIUM CARBONATE-VITAMIN D 500-200 MG-UNIT PO TABS
1.0000 | ORAL_TABLET | ORAL | Status: DC
  Administered 2015-03-17 – 2015-03-25 (×8): 1 via ORAL
  Filled 2015-03-16 (×8): qty 1

## 2015-03-16 MED ORDER — WARFARIN SODIUM 1 MG PO TABS
2.5000 mg | ORAL_TABLET | ORAL | Status: DC
  Administered 2015-03-17: 2.5 mg via ORAL
  Filled 2015-03-16: qty 3

## 2015-03-16 MED ORDER — DILTIAZEM HCL 25 MG/5ML IV SOLN
5.0000 mg | Freq: Once | INTRAVENOUS | Status: AC
  Administered 2015-03-16: 5 mg via INTRAVENOUS

## 2015-03-16 MED ORDER — INSULIN ASPART 100 UNIT/ML ~~LOC~~ SOLN
0.0000 [IU] | Freq: Three times a day (TID) | SUBCUTANEOUS | Status: DC
  Administered 2015-03-17: 2 [IU] via SUBCUTANEOUS
  Administered 2015-03-17 – 2015-03-21 (×10): 1 [IU] via SUBCUTANEOUS
  Administered 2015-03-21 – 2015-03-22 (×2): 2 [IU] via SUBCUTANEOUS
  Administered 2015-03-23: 1 [IU] via SUBCUTANEOUS
  Administered 2015-03-23 – 2015-03-24 (×2): 2 [IU] via SUBCUTANEOUS
  Administered 2015-03-24: 1 [IU] via SUBCUTANEOUS
  Administered 2015-03-25: 2 [IU] via SUBCUTANEOUS
  Administered 2015-03-25: 1 [IU] via SUBCUTANEOUS
  Filled 2015-03-16: qty 2
  Filled 2015-03-16 (×3): qty 1
  Filled 2015-03-16: qty 2
  Filled 2015-03-16 (×9): qty 1
  Filled 2015-03-16 (×4): qty 2
  Filled 2015-03-16: qty 1

## 2015-03-16 MED ORDER — ONDANSETRON HCL 4 MG/2ML IJ SOLN
INTRAMUSCULAR | Status: AC
  Filled 2015-03-16: qty 2

## 2015-03-16 MED ORDER — SIMVASTATIN 20 MG PO TABS
20.0000 mg | ORAL_TABLET | Freq: Every evening | ORAL | Status: DC
  Administered 2015-03-16 – 2015-03-24 (×7): 20 mg via ORAL
  Filled 2015-03-16 (×7): qty 1

## 2015-03-16 MED ORDER — ALBUTEROL SULFATE (2.5 MG/3ML) 0.083% IN NEBU: 2.5000 mg | INHALATION_SOLUTION | RESPIRATORY_TRACT | Status: DC | PRN

## 2015-03-16 MED ORDER — SODIUM CHLORIDE 0.9 % IV BOLUS (SEPSIS)
1000.0000 mL | Freq: Once | INTRAVENOUS | Status: AC
  Administered 2015-03-16: 1000 mL via INTRAVENOUS

## 2015-03-16 MED ORDER — SODIUM CHLORIDE 0.9 % IJ SOLN: 3.0000 mL | INTRAMUSCULAR | Status: DC | PRN

## 2015-03-16 MED ORDER — SODIUM CHLORIDE 0.9 % IV SOLN: 250.0000 mL | INTRAVENOUS | Status: DC | PRN

## 2015-03-16 MED ORDER — ONDANSETRON HCL 4 MG/2ML IJ SOLN: 4.0000 mg | Freq: Four times a day (QID) | INTRAMUSCULAR | Status: DC | PRN

## 2015-03-16 MED ORDER — SODIUM CHLORIDE 0.9 % IJ SOLN
3.0000 mL | Freq: Two times a day (BID) | INTRAMUSCULAR | Status: DC
Start: 2015-03-16 — End: 2015-03-24
  Administered 2015-03-17 – 2015-03-23 (×11): 3 mL via INTRAVENOUS

## 2015-03-16 MED ORDER — CIPROFLOXACIN HCL 500 MG PO TABS: 500.0000 mg | ORAL_TABLET | Freq: Two times a day (BID) | ORAL | Status: DC

## 2015-03-16 MED ORDER — LORAZEPAM 2 MG/ML IJ SOLN
INTRAMUSCULAR | Status: AC
  Administered 2015-03-16: 0.5 mg via INTRAVENOUS
  Filled 2015-03-16: qty 1

## 2015-03-16 MED ORDER — LEVOFLOXACIN IN D5W 500 MG/100ML IV SOLN
500.0000 mg | INTRAVENOUS | Status: DC
  Administered 2015-03-16: 500 mg via INTRAVENOUS
  Filled 2015-03-16 (×2): qty 100

## 2015-03-16 MED ORDER — CIPROFLOXACIN HCL 500 MG PO TABS
750.0000 mg | ORAL_TABLET | Freq: Once | ORAL | Status: AC
  Administered 2015-03-16: 750 mg via ORAL

## 2015-03-16 MED ORDER — SODIUM CHLORIDE 0.9 % IV SOLN: Freq: Once | INTRAVENOUS | Status: DC

## 2015-03-16 MED ORDER — LEVOFLOXACIN IN D5W 500 MG/100ML IV SOLN
INTRAVENOUS | Status: AC
  Filled 2015-03-16: qty 100

## 2015-03-16 MED ORDER — WARFARIN - PHARMACIST DOSING INPATIENT: Freq: Every day | Status: DC

## 2015-03-16 MED ORDER — ASPIRIN EC 81 MG PO TBEC
81.0000 mg | DELAYED_RELEASE_TABLET | ORAL | Status: DC
Start: 2015-03-17 — End: 2015-03-17
  Administered 2015-03-17: 81 mg via ORAL
  Filled 2015-03-16: qty 1

## 2015-03-16 MED ORDER — ENALAPRIL MALEATE 5 MG PO TABS
5.0000 mg | ORAL_TABLET | Freq: Two times a day (BID) | ORAL | Status: DC
  Administered 2015-03-17 – 2015-03-25 (×15): 5 mg via ORAL
  Filled 2015-03-16 (×17): qty 1

## 2015-03-16 MED ORDER — WARFARIN SODIUM 1 MG PO TABS
5.0000 mg | ORAL_TABLET | ORAL | Status: DC
  Administered 2015-03-16: 5 mg via ORAL
  Filled 2015-03-16: qty 5

## 2015-03-16 NOTE — Progress Notes (Signed)
This patient has received 11 ml's of IV Omnipaque 300 (type of contrast) contrast extravasation into left arm (part of body) during a CT Abdomen exam.  The exam was performed on (date) 03/16/15  Site / affected area assessed by RN staff

## 2015-03-16 NOTE — ED Notes (Signed)
Daughter Darel HongJudy left number [phone]

## 2015-03-16 NOTE — Progress Notes (Signed)
Madison EdwardsMarie Montoya    [id number]   13-Jan-1932  Type of procedure: CT Abdomen  03/16/2015  [facility] some of the x-ray dye leaked into the tissues around the vein that the x-ray dye was given through.  What should you do?  1. Apply ice 20 minutes four times a day for three days.  2. Keep the affected extremity elevated above the rest of your body for 48 hours.  3. If you develop any of the following signs or symptoms, please contact Radiology at [phone].  Increased pain  Increasing swelling   Change in sensation (ex. Numbness, tingling)  Development of redness or increase in warmth around the affected area  Increasing hardness  Blistering   I have read and understand these instructions.  Any questions I raised have been discussed to my satisfaction.  I understand that failure to follow these instructions may result in additional complications.

## 2015-03-16 NOTE — ED Notes (Signed)
CRITICAL VALUE ALERT  Critical value received: 0.04  Date of notification:  05/07/206  Time of notification:  1405  Critical value read back:Yes.    Nurse who received alert:  Brieann Osinski,RN  MD notified (1st page): Dr Darnelle CatalanMalinda  Time of first page:  1405  MD notified (2nd page):  Time of second page:  Responding MD:  Darnelle CatalanMalinda  Time MD responded:  [phone]

## 2015-03-16 NOTE — H&P (Addendum)
[facility] [place] at [facility]   PATIENT NAME: Madison Montoya    MR#:  [id number]  DATE OF BIRTH:  Aug 24, 1932  DATE OF ADMISSION:  03/16/2015  PRIMARY CARE PHYSICIAN: [facility]   REQUESTING/REFERRING PHYSICIAN: Governor Rooks.  CHIEF COMPLAINT:   Chief Complaint  Patient presents with  . Chest Pain  . Shortness of Breath  . Altered Mental Status    HISTORY OF PRESENT ILLNESS:  Madison Montoya  is a 81 y.o. female with a known history of HTN, DM, Afib and dementia. The patient is a demented, and able to provide information. According to patient and daughter and the son, patient was noticed to be confused and has urine odor. In addition, patient had nausea and vomiting. The patient was sent to the ED for possible UTI. Patient was found to have a UTI and was treated with the Levaquin by mouth 1 dose. Patient was being to be discharged home. However, patient was found to have A. fib with RVR at 166. She was treated with a Cardizem IV push 3 doses, but the heart rate is a still fast at about 130s. Patient was started with a Cardizem drip. The patient has history of A. fib on Coumadin. EKG in the ED showed A. fib with RVR at 166.  PAST MEDICAL HISTORY:   Past Medical History  Diagnosis Date  . Hypertension   . Diabetes mellitus without complication   . Dementia   . Arthritis     PAST SURGICAL HISTORY:   Past Surgical History  Procedure Laterality Date  . Fracture surgery      SOCIAL HISTORY:   History  Substance Use Topics  . Smoking status: Never Smoker   . Smokeless tobacco: Not on file  . Alcohol Use: No    FAMILY HISTORY:  The patient is a demented and unable to get family history.  DRUG ALLERGIES:  No Known Allergies  REVIEW OF SYSTEMS:  The patient is a demented and confused, unable to provide any review of history and review of system. MEDICATIONS AT HOME:   Prior to Admission medications   Medication Sig Start  Date End Date Taking? Authorizing Provider  acetaminophen (TYLENOL) 500 MG tablet Take 500-1,000 mg by mouth 2 (two) times daily as needed for mild pain.    Yes Historical Provider, MD  aspirin EC 81 MG tablet Take 81 mg by mouth every morning.   Yes Historical Provider, MD  calcium-vitamin D (OSCAL WITH D) 500-200 MG-UNIT per tablet Take 1 tablet by mouth every morning.   Yes Historical Provider, MD  enalapril (VASOTEC) 5 MG tablet Take 5 mg by mouth 2 (two) times daily.   Yes Historical Provider, MD  metoprolol (LOPRESSOR) 50 MG tablet Take 25 mg by mouth 2 (two) times daily.   Yes Historical Provider, MD  simvastatin (ZOCOR) 20 MG tablet Take 20 mg by mouth every evening.   Yes Historical Provider, MD  ciprofloxacin (CIPRO) 500 MG tablet Take 1 tablet (500 mg total) by mouth 2 (two) times daily. 03/16/15 03/26/15  Arnaldo Natal, MD      VITAL SIGNS:  Blood pressure 144/91, pulse 108, temperature 98.6 F (37 C), temperature source Oral, resp. rate 30, height  (1.549 m), weight 58.1 kg (128 lb 1.4 oz), SpO2 100 %.  PHYSICAL EXAMINATION:  GENERAL:  81 y.o.-year-old patient lying in the bed with no acute distress.  EYES: Pupils equal, round, reactive to light and accommodation. No scleral  icterus. Extraocular muscles intact.  HEENT: Head atraumatic, normocephalic. Oropharynx and nasopharynx clear.  NECK:  Supple, no jugular venous distention. No thyroid enlargement, no tenderness.  LUNGS: Normal breath sounds bilaterally, no wheezing, rales,rhonchi or crepitation. No use of accessory muscles of respiration.  CARDIOVASCULAR: S1, S2,  irregular rate and rhythm, tachycardia.  No murmurs, rubs, or gallops.  ABDOMEN: Soft, tender lower abdominal area, nondistended. Bowel sounds present. No organomegaly or mass.  EXTREMITIES: No pedal edema, cyanosis, or clubbing.  NEUROLOGIC: Follow commands. Cranial nerves II through XII are intact. Muscle strength 5/5 in all extremities. Sensation intact. Gait  not checked.  PSYCHIATRIC: The patient is demented and confused aerate SKIN: No obvious rash, lesion, or ulcer.   LABORATORY PANEL:   CBC  Recent Labs Lab 03/16/15 1108  WBC 12.0*  HGB 11.0*  HCT 33.4*  PLT 332   ------------------------------------------------------------------------------------------------------------------  Chemistries   Recent Labs Lab 03/16/15 1108  NA 138  K 3.7  CL 106  CO2 21*  GLUCOSE 150*  BUN 21*  CREATININE 1.09*  CALCIUM 10.1  MG 1.7  AST 31  ALT 15  ALKPHOS 42  BILITOT 0.7   ------------------------------------------------------------------------------------------------------------------  Cardiac Enzymes  Recent Labs Lab 03/16/15 1550  TROPONINI 0.04*   ------------------------------------------------------------------------------------------------------------------  RADIOLOGY:  Ct Abdomen Pelvis W Contrast  03/16/2015   CLINICAL DATA:  Foul-smelling urine, nausea and vomiting  EXAM: CT ABDOMEN AND PELVIS WITH CONTRAST  TECHNIQUE: Multidetector CT imaging of the abdomen and pelvis was performed using the standard protocol following bolus administration of intravenous contrast.  CONTRAST:  80mL OMNIPAQUE IOHEXOL 300 MG/ML  SOLN  COMPARISON:  None.  FINDINGS: Lung bases demonstrate some mild emphysematous changes.  The liver, gallbladder, spleen, adrenal glands and pancreas are within normal limits. Small sliding-type hiatal hernia is noted.  The kidneys are well visualized bilaterally. Cortical thinning is noted. The left kidney is somewhat smaller than the right. No obstructive changes or renal calculi are noted.  Diffuse aortoiliac calcifications are seen. The appendix is within normal limits. The bladder is well distended. No pelvic mass lesion is seen. Diverticular change of the colon is noted without diverticulitis. Prominent right ovary is noted with some central calcifications. Calcification was noted on a prior exam from 2013.  Postsurgical changes are noted in the proximal left femur consistent with prior fracture and fixation. Degenerative changes of the lumbar spine are seen. A mild scoliosis of the lumbar spine is noted. No compression deformities are seen.  IMPRESSION: Diverticulosis without diverticulitis.  Changes in the right adnexal region which may represent an old dermoid. The calcification is stable from 2013  No acute abnormality is noted.   Electronically Signed   By: Alcide CleverMark  Lukens M.D.   On: 03/16/2015 15:44   Dg Chest Portable 1 View  03/16/2015   CLINICAL DATA:  Chest pain and shortness of breath. Altered mental status.  EXAM: PORTABLE CHEST - 1 VIEW  COMPARISON:  06/24/2013  FINDINGS: Cardiac silhouette is mildly enlarged. No mediastinal or hilar masses or convincing adenopathy. There is mild interstitial thickening is similar to the prior exam no lung consolidation or convincing pulmonary edema. No pleural effusion or pneumothorax.  Bony thorax is demineralized but grossly intact.  IMPRESSION: No acute cardiopulmonary disease.   Electronically Signed   By: Amie Portlandavid  Ormond M.D.   On: 03/16/2015 11:51    EKG:   Orders placed or performed during the hospital encounter of 03/16/15  . ED EKG  . ED EKG    IMPRESSION  AND PLAN:   A. fib with RVR:  I will continue Cardizem drip and continue Lopressor. In addition, I will continue Coumadin dose per pharmacy and get cardiology consult. UTI: Patient was treated with her 1 dose of Cipro in the ED. I will start Levaquin IV daily from tomorrow, and check urine culture, follow-up CBC. Hypertension: Controlled, I will continue patient's home hypertensive medication. Diabetes: I will start sliding scale. Dementia: Fall precaution.   All the records are reviewed and case discussed with ED provider. Management plans discussed with the patient's son and daughter and they are in agreement.  CODE STATUS: Patient has no  health power of attorney. According to patient's son  and 2 daughters, patient is a full code.  TOTAL TIME TAKING CRITICAL CARE OF THIS PATIENT: 67 minutes.    Shaune Pollackhen, Burl Tauzin M.D on 03/16/2015 at 7:15 PM  Between 7am to 6pm - Pager - [phone]  After 6pm go to [email or web address] - password [email or web address] [facility]  [facility]  Office  [phone]  CC: Primary care physician; [facility]

## 2015-03-16 NOTE — ED Notes (Signed)
Patient presents to the ED with confusion, chest pain and shortness of breath.  Patient states her chest pain started last evening and has been intermittent.  Patient has difficulty answering questions and was unable to tell this RN of her last name.  Patient is oriented to self and place but not time.  Patient has a history of afib.  Patient's son states her urine has a foul odor.  Patient's son states patient vomited last night and has had diarrhea the past couple of days.

## 2015-03-16 NOTE — ED Provider Notes (Signed)
I accepted care from Dr. Juliette AlcideMelinda. Family was intending to go home with the patient being diagnosed with urinary tract infection and a fib. However at 5:40 PM when I checked on the patient she was not feeling well feeling nauseated and her heart rate was back up into the 130s. I gave her an additional dose of diltiazem 5 mg IV. I discussed with the family my concern with the UTI and elevated white blood cell count and elevated heart rate for possible early sepsis and would prefer for her to be observed and monitored for 24 hours.  Consultation hospitalist for admission  Laboratory results reviewed: Repeat troponin 0.04 this is stable from earlier  Diagnosis: Acute urinary tract infection History of fibrillation with rapid ventricular response  Governor Rooksebecca Eureka Valdes, MD 03/16/15 1743

## 2015-03-16 NOTE — Progress Notes (Signed)
ANTICOAGULATION CONSULT NOTE - Initial Consult  Pharmacy Consult for Warfarin Indication: atrial fibrillation  No Known Allergies  Patient Measurements: Height: 5\' 1"  (154.9 cm) Weight: 128 lb 1.4 oz (58.1 kg) IBW/kg (Calculated) : 47.8 Heparin Dosing Weight:  Vital Signs: Temp: 98.6 F (37 C) (05/07 1107) Temp Source: Oral (05/07 1107) BP: 130/86 mmHg (05/07 2027) Pulse Rate: 106 (05/07 2027)  Labs:  Recent Labs  03/16/15 1108 03/16/15 1550 03/16/15 2006  HGB 11.0*  --   --   HCT 33.4*  --   --   PLT 332  --   --   LABPROT  --   --  24.0*  INR  --   --  2.13  CREATININE 1.09*  --   --   TROPONINI 0.04* 0.04*  --     Estimated Creatinine Clearance: 33.2 mL/min (by C-G formula based on Cr of 1.09).   Medical History: Past Medical History  Diagnosis Date  . Hypertension   . Diabetes mellitus without complication   . Dementia   . Arthritis     Medications:  Prescriptions prior to admission  Medication Sig Dispense Refill Last Dose  . acetaminophen (TYLENOL) 500 MG tablet Take 500-1,000 mg by mouth 2 (two) times daily as needed for mild pain.    prn at prn  . aspirin EC 81 MG tablet Take 81 mg by mouth every morning.   03/16/2015 at 0700  . calcium-vitamin D (OSCAL WITH D) 500-200 MG-UNIT per tablet Take 1 tablet by mouth every morning.   03/16/2015 at 0700  . enalapril (VASOTEC) 5 MG tablet Take 5 mg by mouth 2 (two) times daily.   03/16/2015 at 0700  . metoprolol (LOPRESSOR) 50 MG tablet Take 25 mg by mouth 2 (two) times daily.   03/16/2015 at 0700  . simvastatin (ZOCOR) 20 MG tablet Take 20 mg by mouth every evening.   03/15/2015 at pm   Per RN:  Pt takes Warfarin 2.5 mg PO Mon, Tues, Wed, Thurs, Fri and Sun                                Warfarin 5 mg PO Sat  Assessment: 5/7:  INR @ 20:00 = 2.13  Goal of Therapy:  INR 2-3    Plan:  Will continue this pt on home dose.  Warfarin 2.5 mg PO Mon, Tues, Wed, Thurs, Fri and Sun                      Warfarin 5 mg PO Sat  Hawke Villalpando D 03/16/2015,9:43 PM

## 2015-03-16 NOTE — ED Provider Notes (Signed)
[facility]   ____________________________________________  Time seen: 1110  I have reviewed the triage vital signs and the nursing notes.   HISTORY  Chief Complaint Chest Pain; Shortness of Breath; and Altered Mental Status       HPI Madison Montoya is a 81 y.o. female who is had nausea vomiting and diarrhea for the last 2 days no known fever today patient became confused and complained of chest pain although she points to her belly when she talks about her chest pain son who is with her says last time she was confused like this she had a UTI she has early dementia as well as unable to say when the chest pain started how long it lasted severely as it will kind of pain it is just hurts and she points to her belly hurts to make it better or worse and arrival in the ER patient is pale and he has A. fib with rapid ventricular rate of 260 over usually is in the 120s. Patient laid in bed given IV fluids 1 L 5 mg diltiazem heart rate goes down to 112    Past Medical History  Diagnosis Date  . Hypertension   . Diabetes mellitus without complication   . Dementia   . Arthritis     There are no active problems to display for this patient.   Past Surgical History  Procedure Laterality Date  . Fracture surgery      No current outpatient prescriptions on file.  Allergies Review of patient's allergies indicates no known allergies.  No family history on file.  Social History History  Substance Use Topics  . Smoking status: Never Smoker   . Smokeless tobacco: Not on file  . Alcohol Use: No    Review of Systems Constitutional: Negative for fever. Eyes: Negative for visual changes. ENT: Negative for sore throat. Cardiovascular: Negative for chest pain. Respiratory: Negative for shortness of breath. Gastrointestinal: Negative for abdominal pain, vomiting and diarrhea. Genitourinary: Negative for dysuria. Musculoskeletal: Negative for back pain. Skin:  Negative for rash. Neurological: Negative for headaches, focal weakness or numbness. The redness of this review of systems is limited by the patient's mild dementia and apparent confusion should add she is unable to tell me the day date or location of where she is except for hospital  10-point ROS otherwise negative.  ____________________________________________   PHYSICAL EXAM:  VITAL SIGNS: ED Triage Vitals  Enc Vitals Group     BP 03/16/15 1107 146/106 mmHg     Pulse Rate 03/16/15 1107 120     Resp 03/16/15 1107 34     Temp 03/16/15 1107 98.6 F (37 C)     Temp Source 03/16/15 1107 Oral     SpO2 03/16/15 1107 100 %     Weight 03/16/15 1107 128 lb 1.4 oz (58.1 kg)     Height 03/16/15 1107 5\' 1"  (1.549 m)     Head Cir --      Peak Flow --      Pain Score 03/16/15 1112 8     Pain Loc --      Pain Edu? --      Excl. in GC? --     Constitutional: Alert but somewhat confused very pale looking and does not appear comfortable Eyes: Conjunctivae are normal. PERRL. Normal extraocular movements. ENT   Head: Normocephalic and atraumatic.   Nose: No congestion/rhinnorhea.   Mouth/Throat: Mucous membranes are moist.   Neck: No stridor. Hematological/Lymphatic/Immunilogical: No cervical lymphadenopathy.  Cardiovascular: Heart rate is tachycardic irregularly irregular water warm rubs or murmurs Respiratory: Normal respiratory effort without tachypnea nor retractions. Breath sounds are clear and equal bilaterally. No wheezes/rales/rhonchi. Gastrointestinal: Soft but appears to be diffusely tender bowel sounds are decreased slightly Musculoskeletal: Nontender with normal range of motion in all extremities. No joint effusions.  No lower extremity tenderness nor edema. Neurologic:  Normal speech and language. No gross focal neurologic deficits are appreciated. Speech is normal.  Skin:  Skin is warm, dry and intact. No rash noted.  ____________________________________________     LABS (pertinent positives/negatives)    ____________________________________________   EKG A. fib rapid ventricular response at 166  Great left axis otherwise normal QRS KG #2 done after the diltiazem rate of 120 left axis have resolved there are no QRS or T-wave changes that I can see [facility] of [facility].  ____________________________________________    RADIOLOGY    _____________________________________ Psychiatric: Mood and affect are normal. Speech and behavior are normal. Patient exhibits appropriate insi_______   PROCEDURES  Procedure(s) performed: None  Critical Care performed: No  ____________________________________________   INITIAL IMPRESSION / ASSESSMENT AND PLAN / ED COURSE  Pertinent labs & imaging results that were available during my care of the patient were reviewed by me and considered in my medical decision making (see chart for details).   ____________________________________________   FINAL CLINICAL IMPRESSION(S) / ED DIAGNOSES  Final diagnoses:  Persistent atrial fibrillation  UTI (lower urinary tract infection)    Arnaldo NatalPaul F Malinda, MD 03/16/15 [phone]

## 2015-03-17 ENCOUNTER — Inpatient Hospital Stay (HOSPITAL_COMMUNITY): Payer: Medicare Other

## 2015-03-17 DIAGNOSIS — N390 Urinary tract infection, site not specified: Secondary | ICD-10-CM

## 2015-03-17 DIAGNOSIS — I4891 Unspecified atrial fibrillation: Secondary | ICD-10-CM

## 2015-03-17 DIAGNOSIS — I482 Chronic atrial fibrillation: Secondary | ICD-10-CM

## 2015-03-17 LAB — CBC
HEMATOCRIT: 29.6 % — AB (ref 35.0–47.0)
HEMOGLOBIN: 9.7 g/dL — AB (ref 12.0–16.0)
MCH: 31.2 pg (ref 26.0–34.0)
MCHC: 32.9 g/dL (ref 32.0–36.0)
MCV: 95 fL (ref 80.0–100.0)
Platelets: 308 10*3/uL (ref 150–440)
RBC: 3.12 MIL/uL — AB (ref 3.80–5.20)
RDW: 12.6 % (ref 11.5–14.5)
WBC: 15.1 10*3/uL — AB (ref 3.6–11.0)

## 2015-03-17 LAB — BASIC METABOLIC PANEL
ANION GAP: 9 (ref 5–15)
BUN: 17 mg/dL (ref 6–20)
CALCIUM: 9.3 mg/dL (ref 8.9–10.3)
CO2: 24 mmol/L (ref 22–32)
CREATININE: 1.01 mg/dL — AB (ref 0.44–1.00)
Chloride: 108 mmol/L (ref 101–111)
GFR calc Af Amer: 59 mL/min — ABNORMAL LOW (ref >60)
GFR calc non Af Amer: 51 mL/min — ABNORMAL LOW (ref >60)
Glucose, Bld: 127 mg/dL — ABNORMAL HIGH (ref 65–99)
Potassium: 3.5 mmol/L (ref 3.5–5.1)
Sodium: 141 mmol/L (ref 135–145)

## 2015-03-17 LAB — GLUCOSE, CAPILLARY
GLUCOSE-CAPILLARY: 179 mg/dL — AB (ref 70–99)
GLUCOSE-CAPILLARY: 150 mg/dL — AB (ref 70–99)
GLUCOSE-CAPILLARY: 154 mg/dL — AB (ref 70–99)
Glucose-Capillary: 127 mg/dL — ABNORMAL HIGH (ref 70–99)
Glucose-Capillary: 130 mg/dL — ABNORMAL HIGH (ref 70–99)

## 2015-03-17 LAB — HEMOGLOBIN A1C: Hgb A1c MFr Bld: 6 % (ref 4.0–6.0)

## 2015-03-17 MED ORDER — METOPROLOL TARTRATE 25 MG PO TABS
25.0000 mg | ORAL_TABLET | Freq: Two times a day (BID) | ORAL | Status: DC
  Administered 2015-03-17 – 2015-03-18 (×2): 25 mg via ORAL
  Filled 2015-03-17 (×2): qty 1

## 2015-03-17 MED ORDER — METOPROLOL TARTRATE 25 MG PO TABS: 25.0000 mg | ORAL_TABLET | Freq: Two times a day (BID) | ORAL | Status: DC

## 2015-03-17 MED ORDER — HALOPERIDOL LACTATE 5 MG/ML IJ SOLN
2.5000 mg | Freq: Once | INTRAMUSCULAR | Status: AC
  Administered 2015-03-17: 5 mg via INTRAVENOUS

## 2015-03-17 MED ORDER — DILTIAZEM HCL 100 MG IV SOLR: 5.0000 mg/h | INTRAVENOUS | Status: DC

## 2015-03-17 MED ORDER — LEVOFLOXACIN IN D5W 500 MG/100ML IV SOLN
500.0000 mg | INTRAVENOUS | Status: DC
  Administered 2015-03-17: 500 mg via INTRAVENOUS
  Filled 2015-03-17 (×2): qty 100

## 2015-03-17 MED ORDER — HALOPERIDOL LACTATE 5 MG/ML IJ SOLN
INTRAMUSCULAR | Status: AC
  Administered 2015-03-17: 5 mg via INTRAVENOUS
  Filled 2015-03-17: qty 1

## 2015-03-17 MED ORDER — HALOPERIDOL LACTATE 5 MG/ML IJ SOLN
2.5000 mg | INTRAMUSCULAR | Status: DC | PRN
  Administered 2015-03-17 – 2015-03-24 (×8): 2.5 mg via INTRAVENOUS
  Filled 2015-03-17 (×8): qty 1

## 2015-03-17 MED ORDER — ONDANSETRON HCL 4 MG/2ML IJ SOLN
4.0000 mg | Freq: Four times a day (QID) | INTRAMUSCULAR | Status: DC | PRN
  Administered 2015-03-18 (×2): 4 mg via INTRAVENOUS
  Filled 2015-03-17 (×4): qty 2

## 2015-03-17 MED ORDER — DILTIAZEM HCL 100 MG IV SOLR
5.0000 mg/h | INTRAVENOUS | Status: DC
  Administered 2015-03-17 – 2015-03-18 (×2): 5 mg/h via INTRAVENOUS
  Filled 2015-03-17 (×2): qty 100

## 2015-03-17 NOTE — Progress Notes (Signed)
ANTICOAGULATION CONSULT NOTE - Pharmacy Consult for Warfarin Indication: atrial fibrillation  No Known Allergies  Patient Measurements: Height: 5\' 2"  (157.5 cm) Weight: 126 lb 1.7 oz (57.2 kg) IBW/kg (Calculated) : 50.1 Heparin Dosing Weight:  Vital Signs: Temp: 97.7 F (36.5 C) (05/08 0700) Temp Source: Oral (05/08 0700) BP: 106/84 mmHg (05/08 1100) Pulse Rate: 79 (05/08 1100)  Labs:  Recent Labs  03/16/15 1108 03/16/15 1550 03/16/15 2006 03/16/15 2135 03/17/15 0719  HGB 11.0*  --   --  9.6* 9.7*  HCT 33.4*  --   --  27.9* 29.6*  PLT 332  --   --  289 308  LABPROT  --   --  24.0*  --   --   INR  --   --  2.13  --   --   CREATININE 1.09*  --   --  0.95  --   TROPONINI 0.04* 0.04*  --   --   --     Estimated Creatinine Clearance: 36.7 mL/min (by C-G formula based on Cr of 0.95).   Medical History: Past Medical History  Diagnosis Date  . Hypertension   . Diabetes mellitus without complication   . Dementia   . Arthritis     Medications:  Prescriptions prior to admission  Medication Sig Dispense Refill Last Dose  . acetaminophen (TYLENOL) 500 MG tablet Take 500-1,000 mg by mouth 2 (two) times daily as needed for mild pain.    prn at prn  . aspirin EC 81 MG tablet Take 81 mg by mouth every morning.   03/16/2015 at 0700  . calcium-vitamin D (OSCAL WITH D) 500-200 MG-UNIT per tablet Take 1 tablet by mouth every morning.   03/16/2015 at 0700  . enalapril (VASOTEC) 5 MG tablet Take 5 mg by mouth 2 (two) times daily.   03/16/2015 at 0700  . metoprolol (LOPRESSOR) 50 MG tablet Take 25 mg by mouth 2 (two) times daily.   03/16/2015 at 0700  . simvastatin (ZOCOR) 20 MG tablet Take 20 mg by mouth every evening.   03/15/2015 at pm   Per RN:  Pt takes Warfarin 2.5 mg PO Mon, Tues, Wed, Thurs, Fri and Sun                                Warfarin 5 mg PO Sat  Assessment: 5/7:  INR @ 20:00 = 2.13   Goal of Therapy:  INR 2-3  Plan:  Will continue this pt on home dose.  Warfarin  2.5 mg PO Mon, Tues, Wed, Thurs, Fri and Sun; warfarin 5 mg PO Sat   INR ordered for 5/9   Stormy CardKatsoudas,Lourine Alberico K, RPh Clinical Pharmacist 03/17/2015,11:53 AM

## 2015-03-17 NOTE — Consult Note (Signed)
[facility] patient briefly apneic.ical Center Department of Emergency Medicine   Code Blue CONSULT NOTE  Chief Complaint: unresponsive   Level V Caveat: Unresponsive  History of present illness: I was contacted by the hosptal for a CODE BLUE cardiac arrest upstairs and presented to the patient's bedside.  Upon arrival, the nurse notes that the patient had a brief episode of unresponsiveness and apnea. At the present time the patient is awake and alert, there is mild hypotension, there is a pulse present, and the patient is protecting her airway well and alert.  Patient is awake and alert. Patient is in no apparent distress at this time, care is assumed by Dr. Belia HemanKasa and Dr. Lubertha SouthSunani. They will call back if they require emergent reevaluation from the ED physician.

## 2015-03-17 NOTE — Progress Notes (Signed)
Pt became unresponsive this morning due to HR dropping down into the 40's. Vagal response. Code blue called. No CPR or defib needed Cardizem drip stopped. Vital signs now stable. EKG ordered. She is now alert. 2 New IV's placed. Dr. Cherlynn KaiserSainani and Dr. Belia HemanKasa aware and was at bedside. Bolus on hold due to BP being stable.

## 2015-03-17 NOTE — Consult Note (Signed)
CARDIOLOGY CONSULT NOTE  Patient ID: Madison Montoya MRN: [id number] DOB/AGE: 79/24/1933 79 y.o.  Admit date: 03/16/2015\ Primary Cardiologist: At [facility], [facility] Reason for Consultation: Atrial fibrillation  HPI:  81 yo with history of dementia and chronic atrial fibrillation developed increased confusion over the day or two prior to admission and was noted to have foul-smelling urine.  Due to concern for UTI, family brought patient to ER.  Patient indeed had a UTI.  However, she was also in atrial fibrillation with RVR to the 160s so was started on diltiazem gtt and admitted.  HR came down well.  She was started on levofloxacin.  This morning, she became bradycardic and hypotensive (fall in HR to 40s, atrial fibrillation) and was briefly unresponsive.  She quickly recovered and diltiazem gtt was stopped.  Currently, HR in 80s.  She is awake/alert but forgetful.  No dyspnea or chest pain.  She feels nauseated.   Per family, she has been on coumadin at home.  [facility].  No recent falls.   Review of systems complete and found to be negative unless listed above in HPI  Past Medical History: 1. Chronic atrial fibrillation: She has been on coumadin at home, [facility]. 2. HTN 3. Type II diabetes 4. Dementia 5. Left hip fracture in 2013  FH: No premature CAD  History   Social History  . Marital Status: Single    Spouse Name: N/A  . Number of Children: N/A  . Years of Education: N/A   Occupational History  . Not on file.   Social History Main Topics  . Smoking status: Never Smoker   . Smokeless tobacco: Not on file  . Alcohol Use: No  . Drug Use: Not on file  . Sexual Activity: Not on file   Other Topics Concern  . Not on file   Social History Narrative  . Lives with family     Prescriptions prior to admission  Medication Sig Dispense Refill Last Dose  . acetaminophen (TYLENOL) 500 MG tablet Take 500-1,000 mg by mouth 2 (two)  times daily as needed for mild pain.    prn at prn  . aspirin EC 81 MG tablet Take 81 mg by mouth every morning.   03/16/2015 at 0700  . calcium-vitamin D (OSCAL WITH D) 500-200 MG-UNIT per tablet Take 1 tablet by mouth every morning.   03/16/2015 at 0700  . enalapril (VASOTEC) 5 MG tablet Take 5 mg by mouth 2 (two) times daily.   03/16/2015 at 0700  . metoprolol (LOPRESSOR) 50 MG tablet Take 25 mg by mouth 2 (two) times daily.   03/16/2015 at 0700  . simvastatin (ZOCOR) 20 MG tablet Take 20 mg by mouth every evening.   03/15/2015 at pm   Current Scheduled Meds: . calcium-vitamin D  1 tablet Oral BH-q7a  . enalapril  5 mg Oral BID  . insulin aspart  0-9 Units Subcutaneous TID WC  . levofloxacin (LEVAQUIN) IV  500 mg Intravenous Q24H  . metoprolol  25 mg Oral BID  . metoprolol tartrate  25 mg Oral BID  . simvastatin  20 mg Oral QPM  . sodium chloride  3 mL Intravenous Q12H  . warfarin  2.5 mg Oral Once per day on Sun Mon Tue Wed Thu Fri  . warfarin  5 mg Oral Q Sat  . Warfarin - Pharmacist Dosing Inpatient   Does not apply q1800   Continuous Infusions: .  diltiazem (CARDIZEM) infusion 5 mg/hr (03/17/15 0724)   PRN Meds:.sodium chloride, acetaminophen **OR** acetaminophen, albuterol, haloperidol lactate, ondansetron (ZOFRAN) IV, sodium chloride   Physical exam Blood pressure 106/84, pulse 79, temperature 97.7 F (36.5 C), temperature source Oral, resp. rate 20, height 5\' 2"  (1.575 m), weight 126 lb 1.7 oz (57.2 kg), SpO2 100 %. General: NAD Neck: JVP 8 cm, no thyromegaly or thyroid nodule.  Lungs: Clear to auscultation bilaterally with normal respiratory effort. CV: Nondisplaced PMI.  Heart irregular S1/S2, no S3/S4, no murmur.  No peripheral edema.  No carotid bruit.  Normal pedal pulses.  Abdomen: Soft, nontender, no hepatosplenomegaly, no distention.  Skin: Intact without lesions or rashes.  Neurologic: Alert, memory difficulty.  Psych: Normal affect. Extremities: No clubbing or cyanosis.    HEENT: Normal.   Labs:   Lab Results  Component Value Date   WBC 15.1* 03/17/2015   HGB 9.7* 03/17/2015   HCT 29.6* 03/17/2015   MCV 95.0 03/17/2015   PLT 308 03/17/2015    Recent Labs Lab 03/16/15 1108 03/16/15 2135  NA 138  --   K 3.7  --   CL 106  --   CO2 21*  --   BUN 21*  --   CREATININE 1.09* 0.95  CALCIUM 10.1  --   PROT 6.5  --   BILITOT 0.7  --   ALKPHOS 42  --   ALT 15  --   AST 31  --   GLUCOSE 150*  --    Lab Results  Component Value Date   TROPONINI 0.04* 03/16/2015   Radiology: - CXR: No active disease  EKG:  Atrial fibrillation at 79 bpm  ASSESSMENT AND PLAN: 81 yo with history of dementia and chronic atrial fibrillation presented with atrial fibrillation/RVR and UTI.  1. Atrial fibrillation: Patient is chronically in atrial fibrillation.  I suspect that the rapid rate at admission was driven by the UTI.  She is doing much better now, suspect the atrial fibrillation will settle down with treatment of UTI.  Episode this morning with fall in HR to 40s (atrial fibrillation) and hypotension was likely a vagal event.  She recovered completely.  HR ok now off diltiazem gtt.  - Leave off diltiazem gtt.  - Can restart home metoprolol 25 mg bid starting this evening if no more bradycardia.  - Continue coumadin (no recent falls).  - Can stop ASA 81 as she does not need this in addition to coumadin.  - I will get an echo to assess LV and RV size/function.  2. UTI: Treating per primary service with levofloxacin.  3. Elevated troponin: Very mild.  This was in the setting of atrial fibrillation with RVR and likely represents demand ischemia.  No chest pain.  Would hold off on ischemic evaluation unless clinical picture changes.   She will get an echo.   Marca AnconaDalton Chalonda Schlatter 03/17/2015 11:53 AM

## 2015-03-17 NOTE — Progress Notes (Signed)
   03/17/15 1100  Clinical Encounter Type  Visited With Family  Visit Type Code  Spiritual Encounters  Spiritual Needs Emotional  Responded to Code Blue in patient's room.  Met with family outside CCU. Provided pastoral support till patient was stable.  [place] [phone]

## 2015-03-17 NOTE — Progress Notes (Signed)
[facility] [place] [facility]   PATIENT NAME: Madison EdwardsMarie Joyner    MR#:  [id number]  DATE OF BIRTH:  Oct 26, 1932  SUBJECTIVE:  CHIEF COMPLAINT:   Chief Complaint  Patient presents with  . Chest Pain  . Shortness of Breath  . Altered Mental Status   Pt. Sitting up in bed in NAD. Family at bedside. Had an event earlier today when HR dropped to the 40's and she briefly lost consciousness.  CODE Blue was called. Pt. Received minmal CPR and Cardizem gtt was stopped and HR improved and she is alert and awake now.  Complaining of some chest discomfort which is reproducible.   REVIEW OF SYSTEMS:    Review of Systems  Constitutional: Negative for fever and chills.  HENT: Negative for sore throat and tinnitus.   Eyes: Negative for blurred vision and double vision.  Respiratory: Negative for cough and shortness of breath.   Cardiovascular: Positive for chest pain (reproducible from recent compressions). Negative for orthopnea, leg swelling and PND.  Gastrointestinal: Negative for nausea, vomiting, abdominal pain and diarrhea.  Genitourinary: Negative for dysuria and hematuria.  Neurological: Negative for tremors, focal weakness and seizures.  All other systems reviewed and are negative.   Nutrition: Heart Healthy Tolerating Diet: Yes Tolerating PT: Await Eval.      DRUG ALLERGIES:  No Known Allergies  VITALS:  Blood pressure 139/75, pulse 90, temperature 97.7 F (36.5 C), temperature source Oral, resp. rate 20, height 5\' 2"  (1.575 m), weight 57.2 kg (126 lb 1.7 oz), SpO2 99 %.  PHYSICAL EXAMINATION:   Physical Exam  GENERAL:  81 y.o.-year-old patient lying in the bed with no acute distress.  EYES: Pupils equal, round, reactive to light and accommodation. No scleral icterus. Extraocular muscles intact.  HEENT: Head atraumatic, normocephalic. Oropharynx and nasopharynx clear.  NECK:  Supple, no jugular venous distention. No thyroid enlargement, no  tenderness.  LUNGS: Normal breath sounds bilaterally, no wheezing, rales,rhonchi or crepitation. No use of accessory muscles of respiration.  CARDIOVASCULAR: Irregular, No Gallops, rubs, clicks.   ABDOMEN: Soft, nontender, nondistended. Bowel sounds present. No organomegaly or mass.  EXTREMITIES: No cyanosis, clubbing or edema b/l.    NEUROLOGIC: Cranial nerves II through XII are intact. No focal Motor or sensory deficits b/l.   PSYCHIATRIC: The patient is alert and oriented x 1.   SKIN: No obvious rash, lesion, or ulcer.    LABORATORY PANEL:   CBC  Recent Labs Lab 03/17/15 0719  WBC 15.1*  HGB 9.7*  HCT 29.6*  PLT 308   ------------------------------------------------------------------------------------------------------------------  Chemistries   Recent Labs Lab 03/16/15 1108 03/16/15 2135  NA 138  --   K 3.7  --   CL 106  --   CO2 21*  --   GLUCOSE 150*  --   BUN 21*  --   CREATININE 1.09* 0.95  CALCIUM 10.1  --   MG 1.7  --   AST 31  --   ALT 15  --   ALKPHOS 42  --   BILITOT 0.7  --    ------------------------------------------------------------------------------------------------------------------  Cardiac Enzymes  Recent Labs Lab 03/16/15 1550  TROPONINI 0.04*   ------------------------------------------------------------------------------------------------------------------  RADIOLOGY:  Ct Abdomen Pelvis W Contrast  03/16/2015   CLINICAL DATA:  Foul-smelling urine, nausea and vomiting  EXAM: CT ABDOMEN AND PELVIS WITH CONTRAST  TECHNIQUE: Multidetector CT imaging of the abdomen and pelvis was performed using the standard protocol following bolus administration of intravenous contrast.  CONTRAST:  80mL OMNIPAQUE IOHEXOL 300 MG/ML  SOLN  COMPARISON:  None.  FINDINGS: Lung bases demonstrate some mild emphysematous changes.  The liver, gallbladder, spleen, adrenal glands and pancreas are within normal limits. Small sliding-type hiatal hernia is noted.  The  kidneys are well visualized bilaterally. Cortical thinning is noted. The left kidney is somewhat smaller than the right. No obstructive changes or renal calculi are noted.  Diffuse aortoiliac calcifications are seen. The appendix is within normal limits. The bladder is well distended. No pelvic mass lesion is seen. Diverticular change of the colon is noted without diverticulitis. Prominent right ovary is noted with some central calcifications. Calcification was noted on a prior exam from 2013. Postsurgical changes are noted in the proximal left femur consistent with prior fracture and fixation. Degenerative changes of the lumbar spine are seen. A mild scoliosis of the lumbar spine is noted. No compression deformities are seen.  IMPRESSION: Diverticulosis without diverticulitis.  Changes in the right adnexal region which may represent an old dermoid. The calcification is stable from 2013  No acute abnormality is noted.   Electronically Signed   By: Alcide CleverMark  Lukens M.D.   On: 03/16/2015 15:44   Dg Chest Portable 1 View  03/16/2015   CLINICAL DATA:  Chest pain and shortness of breath. Altered mental status.  EXAM: PORTABLE CHEST - 1 VIEW  COMPARISON:  06/24/2013  FINDINGS: Cardiac silhouette is mildly enlarged. No mediastinal or hilar masses or convincing adenopathy. There is mild interstitial thickening is similar to the prior exam no lung consolidation or convincing pulmonary edema. No pleural effusion or pneumothorax.  Bony thorax is demineralized but grossly intact.  IMPRESSION: No acute cardiopulmonary disease.   Electronically Signed   By: Amie Portlandavid  Ormond M.D.   On: 03/16/2015 11:51     ASSESSMENT AND PLAN:   81 yo female w/ PMH of Dementia, HTN, Type II DM, hx of left hip fracture in 2013, who presented to the hospital due to a. Fib w/ RVR.   * A. Fib w/ RVR - pt. Presented to the hospital w/ HR in 160's.  Much improved now.  - was on Cardizem gtt but weaned off now as pt. Had bradycardic episode earlier  this a.m.  - Cards consulted and will resume Oral Metoprolol for now and monitor.  - on. Coumadin and INR therapeutic.  - await 2D Echo.   * Elevated Troponin - likely in the setting of demand ischemia from A. Fib w/ RVR - cont. B-blocker, Statin. Await Echo.  - Cards following   * UTI - cont. IV Levaquin.  - follow urine cultures.   * Dementia w/ behavioral disturbance - cont. PRN haldol.  - avoid Benzo's.   * HTN - cont. Enalapril, Metoprolol.   * DM - SSI.   Will get PT eval.   All the records are reviewed and case discussed with Care Management/Social Workerr. Management plans discussed with the patient, family and they are in agreement.  CODE STATUS: Full  DVT Prophylaxis: pt. On Coumadin  TOTAL Critical Care TIME TAKING CARE OF THIS PATIENT: 30 minutes.   POSSIBLE D/C IN 1-2 DAYS, DEPENDING ON CLINICAL CONDITION.   Houston SirenSAINANI,Shivaan Tierno J M.D on 03/17/2015 at 12:36 PM  Between 7am to 6pm - Pager - [phone]  After 6pm go to [email or web address] - password [email or web address] [facility]  [facility]  Office  [phone]  CC: Primary care physician; [facility]

## 2015-03-17 NOTE — Progress Notes (Signed)
Remains in a fib with HR 96-112 on cardizem drip at 10 mg/hr. BP WNL, stable. Urine cloudy, foull smelling. Pt has history of dementia, became very upset, anxious, teary when she couldn't remember her childrens' names. Did not calm with comfort measures, Dr Clint GuyHower notified, med with haldol 5 mg with fairly good relief observed.

## 2015-03-17 NOTE — Progress Notes (Signed)
PT Cancellation Note  Patient Details Name: Madison Montoya MRN: [id number] DOB: February 15, 1932   Cancelled Treatment:    Reason Eval/Treat Not Completed: Medical issues which prohibited therapy (Nsng asks PT to hold- reports pt had LOC earlier, now asleep)  Will try back tomorrow.   Malachi ProGalen R Estee Yohe 03/17/2015, 5:15 PM

## 2015-03-18 ENCOUNTER — Encounter: Payer: Self-pay | Admitting: *Deleted

## 2015-03-18 DIAGNOSIS — I4891 Unspecified atrial fibrillation: Secondary | ICD-10-CM

## 2015-03-18 LAB — BASIC METABOLIC PANEL
Anion gap: 8 (ref 5–15)
BUN: 14 mg/dL (ref 6–20)
CHLORIDE: 101 mmol/L (ref 101–111)
CO2: 24 mmol/L (ref 22–32)
Calcium: 9 mg/dL (ref 8.9–10.3)
Creatinine, Ser: 1.05 mg/dL — ABNORMAL HIGH (ref 0.44–1.00)
GFR calc Af Amer: 56 mL/min — ABNORMAL LOW (ref >60)
GFR calc non Af Amer: 48 mL/min — ABNORMAL LOW (ref >60)
GLUCOSE: 147 mg/dL — AB (ref 65–99)
POTASSIUM: 3.6 mmol/L (ref 3.5–5.1)
Sodium: 133 mmol/L — ABNORMAL LOW (ref 135–145)

## 2015-03-18 LAB — CBC
HCT: 29.3 % — ABNORMAL LOW (ref 35.0–47.0)
Hemoglobin: 9.7 g/dL — ABNORMAL LOW (ref 12.0–16.0)
MCH: 31.7 pg (ref 26.0–34.0)
MCHC: 33.3 g/dL (ref 32.0–36.0)
MCV: 95.3 fL (ref 80.0–100.0)
PLATELETS: 321 10*3/uL (ref 150–440)
RBC: 3.07 MIL/uL — AB (ref 3.80–5.20)
RDW: 12.8 % (ref 11.5–14.5)
WBC: 17.1 10*3/uL — ABNORMAL HIGH (ref 3.6–11.0)

## 2015-03-18 LAB — GLUCOSE, CAPILLARY
GLUCOSE-CAPILLARY: 136 mg/dL — AB (ref 70–99)
GLUCOSE-CAPILLARY: 138 mg/dL — AB (ref 70–99)
Glucose-Capillary: 150 mg/dL — ABNORMAL HIGH (ref 70–99)
Glucose-Capillary: 143 mg/dL — ABNORMAL HIGH (ref 70–99)

## 2015-03-18 LAB — PROTIME-INR
INR: 3.11
INR: 3.77
Prothrombin Time: 32.1 seconds — ABNORMAL HIGH (ref 11.4–15.0)
Prothrombin Time: 37.2 seconds — ABNORMAL HIGH (ref 11.4–15.0)

## 2015-03-18 LAB — HEMOGLOBIN
Hemoglobin: 8.9 g/dL — ABNORMAL LOW (ref 12.0–16.0)
Hemoglobin: 8.5 g/dL — ABNORMAL LOW (ref 12.0–16.0)

## 2015-03-18 MED ORDER — LEVOFLOXACIN IN D5W 250 MG/50ML IV SOLN
250.0000 mg | INTRAVENOUS | Status: DC
  Administered 2015-03-18 – 2015-03-20 (×3): 250 mg via INTRAVENOUS
  Filled 2015-03-18 (×4): qty 50

## 2015-03-18 MED ORDER — METOPROLOL TARTRATE 25 MG PO TABS
25.0000 mg | ORAL_TABLET | Freq: Three times a day (TID) | ORAL | Status: DC
  Administered 2015-03-18 – 2015-03-21 (×8): 25 mg via ORAL
  Filled 2015-03-18 (×8): qty 1

## 2015-03-18 MED ORDER — DILTIAZEM HCL 30 MG PO TABS
30.0000 mg | ORAL_TABLET | Freq: Four times a day (QID) | ORAL | Status: DC | PRN
  Administered 2015-03-18: 30 mg via ORAL
  Filled 2015-03-18: qty 1

## 2015-03-18 NOTE — Progress Notes (Signed)
[facility] -  at [facility]   PATIENT NAME: Madison EdwardsMarie Montoya    MR#:  [id number]  DATE OF BIRTH:  Aug 19, 1932  SUBJECTIVE:  CHIEF COMPLAINT:   Chief Complaint  Patient presents with  . Chest Pain  . Shortness of Breath  . Altered Mental Status   Had to placed back on cardizem gtt as HR went up to 130's yesterday evening.  Now off the gtt again and placed on oral metoprolol.  No further vagal episodes.  Having some bloody BM's but Hg. Stable. Still having some reproducible chest pain.     REVIEW OF SYSTEMS:    Review of Systems  Constitutional: Negative for fever and chills.  HENT: Negative for congestion and tinnitus.   Eyes: Negative for blurred vision and double vision.  Respiratory: Negative for cough, shortness of breath and wheezing.   Cardiovascular: Positive for chest pain. Negative for orthopnea and PND.  Gastrointestinal: Positive for blood in stool. Negative for nausea, vomiting, abdominal pain and diarrhea.  Genitourinary: Negative for dysuria and hematuria.  Neurological: Negative for dizziness, sensory change and focal weakness.  All other systems reviewed and are negative.   Nutrition: Heart Healthy Tolerating Diet: Yes Tolerating PT: Yes      DRUG ALLERGIES:  No Known Allergies  VITALS:  Blood pressure 132/78, pulse 109, temperature 97.6 F (36.4 C), temperature source Oral, resp. rate 24, height 5\' 2"  (1.575 m), weight 57.2 kg (126 lb 1.7 oz), SpO2 94 %.  PHYSICAL EXAMINATION:   Physical Exam  GENERAL:  81 y.o.-year-old patient lying in the bed with no acute distress.  EYES: Pupils equal, round, reactive to light and accommodation. No scleral icterus. Extraocular muscles intact.  HEENT: Head atraumatic, normocephalic. Oropharynx and nasopharynx clear.  NECK:  Supple, no jugular venous distention. No thyroid enlargement, no tenderness.  LUNGS: Normal breath sounds bilaterally, no wheezing, rales,rhonchi or crepitation.  No use of accessory muscles of respiration.  CARDIOVASCULAR: Irregular rate, No Gallops, rubs, clicks.   ABDOMEN: Soft, nontender, nondistended. Bowel sounds present. No organomegaly or mass.  EXTREMITIES: No cyanosis, clubbing or edema b/l.    NEUROLOGIC: Cranial nerves II through XII are intact. No focal Motor or sensory deficits b/l.   PSYCHIATRIC: The patient is alert and oriented x 2.   SKIN: No obvious rash, lesion, or ulcer.    LABORATORY PANEL:   CBC  Recent Labs Lab 03/18/15 0451 03/18/15 1207  WBC 17.1*  --   HGB 9.7* 8.9*  HCT 29.3*  --   PLT 321  --    ------------------------------------------------------------------------------------------------------------------  Chemistries   Recent Labs Lab 03/16/15 1108  03/18/15 0451  NA 138  < > 133*  K 3.7  < > 3.6  CL 106  < > 101  CO2 21*  < > 24  GLUCOSE 150*  < > 147*  BUN 21*  < > 14  CREATININE 1.09*  < > 1.05*  CALCIUM 10.1  < > 9.0  MG 1.7  --   --   AST 31  --   --   ALT 15  --   --   ALKPHOS 42  --   --   BILITOT 0.7  --   --   < > = values in this interval not displayed. ------------------------------------------------------------------------------------------------------------------  Cardiac Enzymes  Recent Labs Lab 03/16/15 1550  TROPONINI 0.04*   ------------------------------------------------------------------------------------------------------------------  RADIOLOGY:  No results found.   ASSESSMENT AND PLAN:   81 yo female w/ PMH of  Dementia, HTN, Type II DM, hx of left hip fracture in 2013, who presented to the hospital due to a. Fib w/ RVR.   * A. Fib w/ RVR - pt. Presented to the hospital w/ HR in 160's.  - taken off cardizem gtt yesterday due to vagal episode bradycardia but had to placed back on it as HR increased.  Now off it again and switched back to just oral metoprolol.  - appreciate Cards input.  - on. Coumadin and INR therapeutic.  - Echo done showing normal EF of 55  to 60%.   * Elevated Troponin - likely in the setting of demand ischemia from A. Fib w/ RVR - cont. B-blocker, Statin.  Echo showing normal EF w/ no wall motion abnormalities.   - Cards following   * UTI - cont. IV Levaquin.  - urine culture pending.   * Dementia w/ behavioral disturbance - cont. PRN haldol.  - avoid Benzo's.   * Bloody stools - ?? Hemorrhoidal (vs) Diverticular bleeding.  - Hg. Stable and will monitor.  Hold coumadin and follow serial Hg.  - if Hg < 7 will transfuse.  GI consult.   * HTN - cont. Enalapril, Metoprolol.   * DM - SSI.   Seen by PT and they recommend home w/ home health.   Make step down level or care.   All the records are reviewed and case discussed with Care Management/Social Workerr. Management plans discussed with the patient, family and they are in agreement.  CODE STATUS: Full  DVT Prophylaxis: pt. On Coumadin  TOTAL TIME TAKING CARE OF THIS PATIENT: 30 minutes.   POSSIBLE D/C IN 1-2 DAYS, DEPENDING ON CLINICAL CONDITION.   Houston SirenSAINANI,Maxmillian Carsey J M.D on 03/18/2015 at 3:46 PM  Between 7am to 6pm - Pager - [phone]  After 6pm go to [email or web address] - password [email or web address] [facility]  [facility]  Office  [phone]  CC: Primary care physician; [facility]

## 2015-03-18 NOTE — Progress Notes (Signed)
Pt is alert but confused. A fib on Cm. Cardizem drip re-started at 2100. Sao2 98% on RA. Lung sound clear. UOP good. C/o chest pain Tab tylenol given as ordered. Haldol given x2. Resting in bed without distress.

## 2015-03-18 NOTE — Progress Notes (Signed)
ANTICOAGULATION CONSULT NOTE - Follow Up Consult  Pharmacy Consult for Warfarin Dosing Indication: atrial fibrillation  No Known Allergies  Patient Measurements: Height: 5\' 2"  (157.5 cm) Weight: 126 lb 1.7 oz (57.2 kg) IBW/kg (Calculated) : 50.1   Vital Signs: Temp: 97.6 F (36.4 C) (05/09 0700) Temp Source: Oral (05/09 0700) BP: 147/82 mmHg (05/09 0818) Pulse Rate: 109 (05/09 0451)  Labs:  Recent Labs  03/16/15 1108 03/16/15 1550 03/16/15 2006 03/16/15 2135 03/17/15 0719 03/18/15 0451  HGB 11.0*  --   --  9.6* 9.7* 9.7*  HCT 33.4*  --   --  27.9* 29.6* 29.3*  PLT 332  --   --  289 308 321  LABPROT  --   --  24.0*  --   --  32.1*  INR  --   --  2.13  --   --  3.11  CREATININE 1.09*  --   --  0.95 1.01* 1.05*  TROPONINI 0.04* 0.04*  --   --   --   --     Estimated Creatinine Clearance: 33.2 mL/min (by C-G formula based on Cr of 1.05).   Medications:  Scheduled:  . calcium-vitamin D  1 tablet Oral BH-q7a  . enalapril  5 mg Oral BID  . insulin aspart  0-9 Units Subcutaneous TID WC  . levofloxacin (LEVAQUIN) IV  250 mg Intravenous Q24H  . metoprolol  25 mg Oral BID  . simvastatin  20 mg Oral QPM  . sodium chloride  3 mL Intravenous Q12H  . Warfarin - Pharmacist Dosing Inpatient   Does not apply q1800    Assessment: Patient's INR 3.11 today up from 2.13 on 5/7.   Goal of Therapy:  INR 2-3 Monitor platelets by anticoagulation protocol: Yes   Plan:   1. Will hold warfarin today, 5/9, and obtain INR/CBC with am labs.   2. Will renally adjust levofloxacin from 500mg  IV Q24h to 250mg  IV Q24hr for both indication and renal function.    Pharmacy will continue to monitor and adjust per consult.    Simpson,Michael L 03/18/2015,10:57 AM

## 2015-03-18 NOTE — Progress Notes (Signed)
Pt is awake and responsive. But confused. C/o of chest pain. Tab tylenol given as ordered.  A fib on Cm. sao2 985 on RA. Lung sound clear. Haldol given x2 per prn order.Cardizem drip is in progress 665ml/hr.

## 2015-03-18 NOTE — Evaluation (Signed)
Physical Therapy Evaluation Patient Details Name: Madison Montoya MRN: [id number] DOB: Sep 07, 1932 Today's Date: 03/18/2015   History of Present Illness  presented to ER with SOB, AMS and nausea/vomiting; admitted with UTI.  Also noted with elevated troponin (demand ischemia, peak at 0.04) and A-fib with RVR (rates up to 160s), requiring cardiem drip.  Hospital course signicificant for unresponsive episode due to bradycardia and vagal response.  Clinical Impression  Upon PT evaluation, patient alert, but generally confused; oriented to self only.  Consistently follows simple, one-step commands.  Demonstrates strength and ROM grossly WFL and symmetrical for basic transfers and mobility.  Currently requiring mod assist for bed mobility; able to maintain unsupported sitting balance mod indep.  Demonstrating seated functional reach at least 4-5" outside immediate BOS (with good trunk control).  Additional OOB activities deferred at this time per nursing request for bed-level activity only at this time.  Will continue to assess as patient medically appropriate and able to tolerate. Vitals stable and WFL throughout session; no orthostasis, HR in 80-90s at rest and with activity. Would benefit from skilled PT to address above deficits and promote optimal return to PLOF; Recommend transition to HHPT upon discharge from acute hospitalization (pending additional mobility assessment).  Patient/family aware of recommendations and in agreement with plan.     Follow Up Recommendations Home health PT;Supervision/Assistance - 24 hour    Equipment Recommendations       Recommendations for Other Services       Precautions / Restrictions Precautions Precautions: Fall Precaution Comments: limited to bed level activity only per RN this date (5/9)      Mobility  Bed Mobility Overal bed mobility: Needs Assistance Bed Mobility: Supine to Sit;Sit to Supine     Supine to sit: Mod assist Sit to supine: Mod  assist      Transfers                 General transfer comment: deferred this date; per RN request, limited to bed-level activity only this date  Ambulation/Gait             General Gait Details: deferred this date; per RN request, limited to bed-level activity only this date  Stairs            Wheelchair Mobility    Modified Rankin (Stroke Patients Only)       Balance Overall balance assessment: Needs assistance Sitting-balance support: No upper extremity supported Sitting balance-Leahy Scale: Good Sitting balance - Comments: forward functional reach 4-5" with good trunk control noted                                     Pertinent Vitals/Pain Pain Assessment: Faces Faces Pain Scale: Hurts little more Pain Location: R lateral chest Pain Descriptors / Indicators: Aching Pain Intervention(s): Limited activity within patient's tolerance;Monitored during session;Repositioned    Home Living Family/patient expects to be discharged to:: Private residence Living Arrangements: Children Available Help at Discharge: Family (son works outside of home; patient alone during day.  Other children provide frequent check-in.\) Type of Home: House Home Access: Stairs to enter Entrance Stairs-Rails: None Entrance Stairs-Number of Steps: 1 Home Layout: One level Home Equipment: Walker - 4 wheels      Prior Function Level of Independence: Needs assistance   Gait / Transfers Assistance Needed: Household mobilization with 4WRW, mod indep  ADL's / Homemaking Assistance Needed: Assist as needed for ADLs,  household activities  Comments: Denies fall history within previous six months     Hand Dominance   Dominant Hand: Right    Extremity/Trunk Assessment   Upper Extremity Assessment: Generalized weakness (bilat shoulder elevation to shoulder -height only; otherwise, grossly WFL and at least 4/5)           Lower Extremity Assessment: Generalized  weakness (ROM grossly WFL and symmetrical, strength at last 4-/5)      Cervical / Trunk Assessment:  (forward head, rounded shoudlers)  Communication   Communication: No difficulties  Cognition Arousal/Alertness: Awake/alert Behavior During Therapy: WFL for tasks assessed/performed Overall Cognitive Status: Impaired/Different from baseline (oriented to self; difficulty remembering family at times (tearful as result)) Area of Impairment: Orientation;Memory;Following commands Orientation Level: Disoriented to;Place;Time;Situation     Following Commands: Follows one step commands consistently (performance optimal with mirroring from thearpist)            General Comments      Exercises Other Exercises Other Exercises: Supine LE therex, 1x10, AROM for muscular strength/endurance: ankle pumps, SAQs, heel slides, hip abuct/adduct and SLR.  Good isolated muscle strength and motor control noted.  (10 minutes)      Assessment/Plan    PT Assessment    PT Diagnosis Difficulty walking;Generalized weakness   PT Problem List    PT Treatment Interventions     PT Goals (Current goals can be found in the Care Plan section) Acute Rehab PT Goals Patient Stated Goal: patient unable to verbalize/comprehend; per family, to return home with family PT Goal Formulation: With patient/family Time For Goal Achievement: 04/01/15 Potential to Achieve Goals: Fair Additional Goals Additional Goal #1: Assess and establish goals for OOB transfers and gait as appropriate.    Frequency     Barriers to discharge        Co-evaluation               End of Session   Activity Tolerance: Patient tolerated treatment well Patient left: in bed;with call bell/phone within reach;with bed alarm set;with family/visitor present Nurse Communication: Mobility status         Time: 1039-1101 PT Time Calculation (min) (ACUTE ONLY): 22 min   Charges:   PT Evaluation $Initial PT Evaluation Tier I: 1  Procedure PT Treatments $Therapeutic Exercise: 8-22 mins   PT G Codes:        Laury AxonKristen H Minetta Krisher 03/18/2015, 11:15 AM  Tommy RainwaterKristen H. Manson PasseyBrown, PT, DPT 03/18/2015 11:18 AM [phone]

## 2015-03-18 NOTE — Progress Notes (Signed)
Patient: Madison Montoya / Admit Date: 03/16/2015 / Date of Encounter: 03/18/2015, 11:34 AM   Subjective: Feeling better today. No further chest pain. No SOB. Heart rate stable in the 80s-90s. She did have a couple episodes of RVR into the 1-teens to 120s while using the restroom. Back on metoprolol currently. Family reports daily sundowning with RVR into the 140s. Echo had to be stopped early 2/2 chest pain. Showed EF 55-60%.   Review of Systems: Review of Systems  Constitutional: Positive for weight loss and malaise/fatigue. Negative for fever, chills and diaphoresis.  Eyes: Positive for redness.  Respiratory: Positive for shortness of breath. Negative for cough, hemoptysis, sputum production and wheezing.   Cardiovascular: Positive for chest pain. Negative for palpitations, orthopnea, claudication, leg swelling and PND.  Gastrointestinal: Negative for nausea, vomiting and abdominal pain.  Genitourinary: Positive for dysuria, urgency and frequency. Negative for hematuria and flank pain.  Neurological: Positive for weakness.    Objective: Telemetry: a-fib, 80s Physical Exam: Blood pressure 147/82, pulse 109, temperature 97.6 F (36.4 C), temperature source Oral, resp. rate 18, height  (1.575 m), weight 126 lb 1.7 oz (57.2 kg), SpO2 94 %. Body mass index is 23.06 kg/(m^2). General: Well developed, well nourished, in no acute distress. Head: Normocephalic, atraumatic, sclera non-icteric, no xanthomas, nares are without discharge. Neck: Negative for carotid bruits. JVP not elevated. Lungs: Clear bilaterally to auscultation without wheezes, rales, or rhonchi. Breathing is unlabored. Heart: Irregularly irregular, S1 S2 without murmurs, rubs, or gallops.  Abdomen: Soft, non-tender, non-distended with normoactive bowel sounds. No rebound/guarding. Extremities: No clubbing or cyanosis. No edema. Distal pedal pulses are 2+ and equal bilaterally. Neuro: Alert and oriented X 3. Moves all  extremities spontaneously. Psych:  Responds to questions appropriately with a normal affect.   Intake/Output Summary (Last 24 hours) at 03/18/15 1134 Last data filed at 03/18/15 0600  Gross per 24 hour  Intake 145.67 ml  Output    920 ml  Net -774.33 ml    Inpatient Medications:  . calcium-vitamin D  1 tablet Oral BH-q7a  . enalapril  5 mg Oral BID  . insulin aspart  0-9 Units Subcutaneous TID WC  . levofloxacin (LEVAQUIN) IV  250 mg Intravenous Q24H  . metoprolol  25 mg Oral BID  . simvastatin  20 mg Oral QPM  . sodium chloride  3 mL Intravenous Q12H  . Warfarin - Pharmacist Dosing Inpatient   Does not apply q1800   Infusions:  . diltiazem (CARDIZEM) infusion 5 mg/hr (03/18/15 0600)    Labs:  Recent Labs  03/16/15 1108  03/17/15 0719 03/18/15 0451  NA 138  --  141 133*  K 3.7  --  3.5 3.6  CL 106  --  108 101  CO2 21*  --  24 24  GLUCOSE 150*  --  127* 147*  BUN 21*  --  17 14  CREATININE 1.09*  < > 1.01* 1.05*  CALCIUM 10.1  --  9.3 9.0  MG 1.7  --   --   --   < > = values in this interval not displayed.  Recent Labs  03/16/15 1108  AST 31  ALT 15  ALKPHOS 42  BILITOT 0.7  PROT 6.5  ALBUMIN 3.9    Recent Labs  03/16/15 1108  03/17/15 0719 03/18/15 0451  WBC 12.0*  < > 15.1* 17.1*  NEUTROABS 8.2*  --   --   --   HGB 11.0*  < > 9.7* 9.7*  HCT 33.4*  < > 29.6* 29.3*  MCV 96.1  < > 95.0 95.3  PLT 332  < > 308 321  < > = values in this interval not displayed.  Recent Labs  03/16/15 1108 03/16/15 1550  TROPONINI 0.04* 0.04*   Invalid input(s): POCBNP  Recent Labs  03/16/15 2135  HGBA1C 6.0     Weights: Filed Weights   03/16/15 1107 03/16/15 2050 03/17/15 0529  Weight: 128 lb 1.4 oz (58.1 kg) 127 lb 3.3 oz (57.7 kg) 126 lb 1.7 oz (57.2 kg)     Radiology/Studies:  Ct Abdomen Pelvis W Contrast  03/16/2015   CLINICAL DATA:  Foul-smelling urine, nausea and vomiting  EXAM: CT ABDOMEN AND PELVIS WITH CONTRAST  TECHNIQUE: Multidetector CT  imaging of the abdomen and pelvis was performed using the standard protocol following bolus administration of intravenous contrast.  CONTRAST:  80mL OMNIPAQUE IOHEXOL 300 MG/ML  SOLN  COMPARISON:  None.  FINDINGS: Lung bases demonstrate some mild emphysematous changes.  The liver, gallbladder, spleen, adrenal glands and pancreas are within normal limits. Small sliding-type hiatal hernia is noted.  The kidneys are well visualized bilaterally. Cortical thinning is noted. The left kidney is somewhat smaller than the right. No obstructive changes or renal calculi are noted.  Diffuse aortoiliac calcifications are seen. The appendix is within normal limits. The bladder is well distended. No pelvic mass lesion is seen. Diverticular change of the colon is noted without diverticulitis. Prominent right ovary is noted with some central calcifications. Calcification was noted on a prior exam from 2013. Postsurgical changes are noted in the proximal left femur consistent with prior fracture and fixation. Degenerative changes of the lumbar spine are seen. A mild scoliosis of the lumbar spine is noted. No compression deformities are seen.  IMPRESSION: Diverticulosis without diverticulitis.  Changes in the right adnexal region which may represent an old dermoid. The calcification is stable from 2013  No acute abnormality is noted.   Electronically Signed   By: Alcide CleverMark  Lukens M.D.   On: 03/16/2015 15:44   Dg Chest Portable 1 View  03/16/2015   CLINICAL DATA:  Chest pain and shortness of breath. Altered mental status.  EXAM: PORTABLE CHEST - 1 VIEW  COMPARISON:  06/24/2013  FINDINGS: Cardiac silhouette is mildly enlarged. No mediastinal or hilar masses or convincing adenopathy. There is mild interstitial thickening is similar to the prior exam no lung consolidation or convincing pulmonary edema. No pleural effusion or pneumothorax.  Bony thorax is demineralized but grossly intact.  IMPRESSION: No acute cardiopulmonary disease.    Electronically Signed   By: Amie Portlandavid  Ormond M.D.   On: 03/16/2015 11:51     Assessment and Plan  81 y.o. female with history of dementia and chronic atrial fibrillation presented on 5/8 with atrial fibrillation/RVR and UTI.   1. Chronic atrial fibrillation with RVR:  -Suspect that the rapid rate at admission was driven by the UTI. She continues to do have an controlled ventricular heart rate, outside of a couple episodes of RVR this AM with using the bathroom. Heart rate currently in the 80s-90s. -Suspect the atrial fibrillation will settle down with treatment of UTI. Episode on the morning of admission with fall in HR to 40s (atrial fibrillation) and hypotension was likely a vagal event. She recovered completely.HR ok now off diltiazem gtt.  - Leave off diltiazem gtt.  - Continue metoprolol 25 mg bid  - Family notes daily sundowning at home with RVR into the 140s, could add into  prn diltazem 30 mg to aid in preventing tachycardia mediated cardiomyopathy   - Continue coumadin (no recent falls).  - Can stop ASA 81 as she does not need this in addition to coumadin.  - Limited echo showed normal EF, had to be stopped 2/2 chest pain. Can repeat as outpatient.   2. UTI: Treating per primary service with levofloxacin.   3. Elevated troponin: Very mild with flat plateau. This was in the setting of atrial fibrillation with RVR and likely represents demand ischemia.Would hold off on ischemic evaluation unless clinical picture changes. Can repeat echo as outpatient along with stress test if family wishes.      Signed, Eula Listenyan Miliani Deike, PA-C

## 2015-03-18 NOTE — Consult Note (Signed)
The patient is an 81 year old white female with a history of dementia and heart disease  Please see detailed consult by Kelvin CellarKimberley Mills nurse practitioner  Patient has a history of atrial fibrillation and is on Coumadin and was noted to have bright red blood per rectum she also had an elevated heart rate of 166 and was treated for this and the heart rate went down to 40.  She also has hypertension and diabetes and urinary tract infection for which she is on Levaquin.  Examination shows her chest is clear to auscultation heart shows a regular rhythm and abdomen is soft nontender no palpable hepatosplenomegaly  At this time plan to treat conservatively and transfuse as needed, considering her dementia and her heart disease and her labile rhythm we will not be pursuing any colonoscopy or upper endoscopy at this time and will follow with you.

## 2015-03-18 NOTE — Progress Notes (Signed)
Consultation  Referring Provider: Dr. Cherlynn KaiserSainani Primary Care Physician:  [facility]  Gastroenterologist:  Dr. Lynnae Prudeobert Elliott       Reason for Consultation: GI bleed            HPI:   Madison Montoya is a 81 y.o. female with known history of HTN< DM, A fib on coumadin, dementia and was at home and developed nausea/slight vomiting, reports of LUQ discomfort,  strong  urine, poor intake and was brought to the ER for suspected UTI. Her daughter reports recurrent UTI, weight loss of about 60 lbs, and FTT since hip fracture 2 years ago.   Patient was found to have a UTI and was treated with the Levaquin. She was found to have A. fib with RVR at 166. She was treated with a Cardizem IV push 3 doses, but the heart rate is a still fast at about 130s. Patient was started with a Cardizem drip. Yesterday, She had brief episode of unresponsiveness and apnea with HR 40's and brief hypotension. She quickly recovered with brief CPR. She remains in the CCU on Cardizem drip with plans for weaning this off.   Today, nursing reported 1 small BM this am with BRBPR- like a hemorrhoid smear. The second BM showed a moderate amount with fresher blood and clots- this looked like a GI bleed. The third BM was much small BRBPR mixed with urine. Vitals are stable. Hgb is drifting slightly.  Her admission CT of abd/pelvis  03/16/2015 showed diverticulosis.   Past Medical History  Diagnosis Date  . Hypertension   . Diabetes mellitus without complication   . Dementia   . Arthritis     Past Surgical History  Procedure Laterality Date  . Fracture surgery      History reviewed. No pertinent family history.   History  Substance Use Topics  . Smoking status: Never Smoker   . Smokeless tobacco: Not on file  . Alcohol Use: No    Prior to Admission medications   Medication Sig Start Date End Date Taking? Authorizing Provider  acetaminophen (TYLENOL) 500 MG tablet Take 500-1,000 mg by mouth 2 (two)  times daily as needed for mild pain.    Yes Historical Provider, MD  aspirin EC 81 MG tablet Take 81 mg by mouth every morning.   Yes Historical Provider, MD  calcium-vitamin D (OSCAL WITH D) 500-200 MG-UNIT per tablet Take 1 tablet by mouth every morning.   Yes Historical Provider, MD  enalapril (VASOTEC) 5 MG tablet Take 5 mg by mouth 2 (two) times daily.   Yes Historical Provider, MD  metoprolol (LOPRESSOR) 50 MG tablet Take 25 mg by mouth 2 (two) times daily.   Yes Historical Provider, MD  simvastatin (ZOCOR) 20 MG tablet Take 20 mg by mouth every evening.   Yes Historical Provider, MD  ciprofloxacin (CIPRO) 500 MG tablet Take 1 tablet (500 mg total) by mouth 2 (two) times daily. 03/16/15 03/26/15  Arnaldo NatalPaul F Malinda, MD    Current Facility-Administered Medications  Medication Dose Route Frequency Provider Last Rate Last Dose  . 0.9 %  sodium chloride infusion  250 mL Intravenous PRN Shaune PollackQing Chen, MD      . acetaminophen (TYLENOL) tablet 650 mg  650 mg Oral Q6H PRN Shaune PollackQing Chen, MD   650 mg at 03/18/15 1109   Or  . acetaminophen (TYLENOL) suppository 650 mg  650 mg Rectal Q6H PRN Shaune PollackQing Chen, MD      . albuterol (PROVENTIL) (2.5 MG/3ML) 0.083%  nebulizer solution 2.5 mg  2.5 mg Nebulization Q2H PRN Shaune Pollack, MD      . calcium-vitamin D (OSCAL WITH D) 500-200 MG-UNIT per tablet 1 tablet  1 tablet Oral Lesle Reek Shaune Pollack, MD   1 tablet at 03/18/15 [id number]  . diltiazem (CARDIZEM) tablet 30 mg  30 mg Oral Q6H PRN Iran Ouch, MD      . enalapril (VASOTEC) tablet 5 mg  5 mg Oral BID Shaune Pollack, MD   5 mg at 03/18/15 1107  . haloperidol lactate (HALDOL) injection 2.5 mg  2.5 mg Intravenous Q4H PRN Houston Siren, MD   2.5 mg at 03/18/15 0345  . insulin aspart (novoLOG) injection 0-9 Units  0-9 Units Subcutaneous TID WC Shaune Pollack, MD   1 Units at 03/18/15 1217  . Levofloxacin (LEVAQUIN) IVPB 250 mg  250 mg Intravenous Q24H Houston Siren, MD      . metoprolol tartrate (LOPRESSOR) tablet 25 mg  25 mg Oral 3  times per day Iran Ouch, MD      . ondansetron (ZOFRAN) injection 4 mg  4 mg Intravenous Q6H PRN Houston Siren, MD   4 mg at 03/18/15 1109  . simvastatin (ZOCOR) tablet 20 mg  20 mg Oral QPM Shaune Pollack, MD   20 mg at 03/17/15 1709  . sodium chloride 0.9 % injection 3 mL  3 mL Intravenous Q12H Shaune Pollack, MD   3 mL at 03/18/15 0941  . sodium chloride 0.9 % injection 3 mL  3 mL Intravenous PRN Shaune Pollack, MD      . Warfarin - Pharmacist Dosing Inpatient   Does not apply [id number] Shaune Pollack, MD        Allergies as of 03/16/2015  . (No Known Allergies)     Review of Systems:    A 12 system review was not obtained secondary to her dementia. Her family assisted with the history.     Physical Exam:  Vital signs in last 24 hours: Temp:  [97.6 F (36.4 C)-98.2 F (36.8 C)] 97.6 F (36.4 C) (05/09 0700) Pulse Rate:  [61-175] 109 (05/09 0451) Resp:  [15-29] 24 (05/09 1200) BP: (91-164)/(58-138) 132/78 mmHg (05/09 1200) SpO2:  [94 %-100 %] 94 % (05/09 1107)    General:  Thin, elderly female  in no acute distress Head:  Head without obvious abnormality, atraumatic  Eyes:   Conjunctiva pink, sclera anicteric   ENT:   Mouth free of lesions, mucosa moist, tongue pink, no thrush noted, teeth and gums normal Neck:   Supple w/o thyromegaly or mass, trachea midline, no adenopathy  Lungs: Clear to auscultation bilaterally, respirations unlabored Heart:     Irreg, Irreg , no rubs, murmurs, gallops. Abdomen: Soft, slightly tender all over abdomen with palpation,  no hepatosplenomegaly, hernia, or mass and BS normal Rectal: Deferred Lymph:  No cervical or supraclavicular adenopathy. Extremities:   No edema, cyanosis, or clubbing Skin  Skin color pale,  no rashes or lesions Neuro:   Dementia noted. She does not know the name or relationship of her daughter or sister in law who are at her bedside,  Psych:  Pleasant and confused.   Data Reviewed:  LAB RESULTS:  Recent Labs  03/17/15 0719  03/18/15 0451 03/18/15 1207  WBC 15.1* 17.1*  --   HGB 9.7* 9.7* 8.9*  HCT 29.6* 29.3*  --   PLT 308 321  --    BMET  Recent Labs  03/17/15 0719 03/18/15 0451  NA 141 133*  K 3.5 3.6  CL 108 101  CO2 24 24  GLUCOSE 127* 147*  BUN 17 14  CREATININE 1.01* 1.05*  CALCIUM 9.3 9.0   LFT  Recent Labs  03/16/15 1108  PROT 6.5  ALBUMIN 3.9  AST 31  ALT 15  ALKPHOS 42  BILITOT 0.7   PT/INR  Recent Labs  03/16/15 2006 03/18/15 0451  LABPROT 24.0* 32.1*  INR 2.13 3.11    STUDIES: Ct Abdomen Pelvis W Contrast  03/16/2015   CLINICAL DATA:  Foul-smelling urine, nausea and vomiting  EXAM: CT ABDOMEN AND PELVIS WITH CONTRAST  TECHNIQUE: Multidetector CT imaging of the abdomen and pelvis was performed using the standard protocol following bolus administration of intravenous contrast.  CONTRAST:  80mL OMNIPAQUE IOHEXOL 300 MG/ML  SOLN  COMPARISON:  None.  FINDINGS: Lung bases demonstrate some mild emphysematous changes.  The liver, gallbladder, spleen, adrenal glands and pancreas are within normal limits. Small sliding-type hiatal hernia is noted.  The kidneys are well visualized bilaterally. Cortical thinning is noted. The left kidney is somewhat smaller than the right. No obstructive changes or renal calculi are noted.  Diffuse aortoiliac calcifications are seen. The appendix is within normal limits. The bladder is well distended. No pelvic mass lesion is seen. Diverticular change of the colon is noted without diverticulitis. Prominent right ovary is noted with some central calcifications. Calcification was noted on a prior exam from 2013. Postsurgical changes are noted in the proximal left femur consistent with prior fracture and fixation. Degenerative changes of the lumbar spine are seen. A mild scoliosis of the lumbar spine is noted. No compression deformities are seen.  IMPRESSION: Diverticulosis without diverticulitis.  Changes in the right adnexal region which may represent an old  dermoid. The calcification is stable from 2013  No acute abnormality is noted.   Electronically Signed   By: Alcide CleverMark  Lukens M.D.   On: 03/16/2015 15:44     Assessment:  Madison Montoya is a 81 y.o. Dementia, HTN, Type II DM, hx of left hip fracture in 2013, who presented to the hospital due to a. Fib w/ RVR. She has a UTI on Levaquin. She developed BRBPR this am and her coumadin is now on hold. CT of abd showed diverticulosis. Her last colonoscopy was at least 10 years or more ago. She had n/v/ LUQ pain reported PTA. She has mild generalized abdominal discomfort on exam- soft abd. Her etiology of her bleeding can be due to diverticular bleed, IH, malignancy, ischemia.     Plan:  She is not a colonoscopy candidate at this time due to her recent cardiac events. She is off coumadin today and will be monitoring her cbc q 8 hr and monitor rectal bleeding. Recommend clear liquid diet. Check Pt/INR.     This case was discussed with Dr. Scot Junobert T. Elliott in collaboration of care. Thank you for the consultation.  These services provided by Amedeo KinsmanKimberly Marcy Bogosian RN, MSN, ANP-BC under collaborative practice agreement with Scot Junobert T. Elliott, MD.  03/18/2015, 2:12 PM

## 2015-03-19 DIAGNOSIS — I4819 Other persistent atrial fibrillation: Secondary | ICD-10-CM | POA: Insufficient documentation

## 2015-03-19 DIAGNOSIS — I481 Persistent atrial fibrillation: Principal | ICD-10-CM

## 2015-03-19 DIAGNOSIS — D500 Iron deficiency anemia secondary to blood loss (chronic): Secondary | ICD-10-CM

## 2015-03-19 DIAGNOSIS — K922 Gastrointestinal hemorrhage, unspecified: Secondary | ICD-10-CM

## 2015-03-19 LAB — ABO/RH: ABO/RH(D): A POS

## 2015-03-19 LAB — GLUCOSE, CAPILLARY
GLUCOSE-CAPILLARY: 134 mg/dL — AB (ref 70–99)
GLUCOSE-CAPILLARY: 136 mg/dL — AB (ref 70–99)
GLUCOSE-CAPILLARY: 120 mg/dL — AB (ref 70–99)

## 2015-03-19 LAB — HEMOGLOBIN: Hemoglobin: 7.3 g/dL — ABNORMAL LOW (ref 12.0–16.0)

## 2015-03-19 MED ORDER — SODIUM CHLORIDE 0.9 % IV SOLN
Freq: Once | INTRAVENOUS | Status: AC
  Administered 2015-03-19: 18:00:00 via INTRAVENOUS

## 2015-03-19 MED ORDER — SODIUM CHLORIDE 0.9 % IV SOLN
INTRAVENOUS | Status: AC
  Administered 2015-03-19 – 2015-03-20 (×2): via INTRAVENOUS

## 2015-03-19 MED ORDER — SODIUM CHLORIDE 0.9 % IV BOLUS (SEPSIS)
500.0000 mL | Freq: Once | INTRAVENOUS | Status: AC
  Administered 2015-03-19: 500 mL via INTRAVENOUS

## 2015-03-19 MED ORDER — DILTIAZEM HCL 30 MG PO TABS
30.0000 mg | ORAL_TABLET | Freq: Four times a day (QID) | ORAL | Status: DC
  Administered 2015-03-19 – 2015-03-23 (×13): 30 mg via ORAL
  Filled 2015-03-19 (×13): qty 1

## 2015-03-19 NOTE — Clinical Social Work Note (Signed)
Clinical Social Work Assessment  Patient Details  Name: Madison Montoya MRN: [id number] Date of Birth: April 15, 1932  Date of referral:  03/19/15               Reason for consult:  Facility Placement                Permission sought to share information with:  Family Supports Permission granted to share information::  Yes, Verbal Permission Granted  Name::        Agency::     Relationship::     Contact Information:     Housing/Transportation Living arrangements for the past 2 months:   (house) Source of Information:  Adult Children (son: Madison Montoya: [phone]) Patient Interpreter Needed:  None Criminal Activity/Legal Involvement Pertinent to Current Situation/Hospitalization:  No - Comment as needed Significant Relationships:  Adult Children Lives with:  Self Do you feel safe going back to the place where you live?  Yes Need for family participation in patient care:  Yes (Comment)  Care giving concerns:  Patient's grandson lives with patient but he is not at home often and thus patient is alone most of the time.  Social Worker assessment / plan:  CSW noted that patient is confused with dementia as baseline and currently is fatigued from working with physical therapy this morning. CSW contacted patient's emergency contact, patient's son: Madison Montoya via phone this morning. Madison Montoya informed CSW that he lives a few houses down from patient and that patient has many family members and support. CSW discussed recommendations by physical therapy and he is in agreement with short term rehab. He stated that [facility] when she broke her hip. Madison Montoya states that patient is exhibiting confusion at home but he states it is typically not bad enough to where she cannot care for herself. He stated that she has worsened over the past couple of weeks due to the uti and that she is known to worsen in her mental status when she becomes sick. Bedsearch to be initiated.   Employment status:   Retired Health and safety inspectornsurance information:  Medicare PT Recommendations:    Information / Referral to community resources:  Skilled Nursing Facility  Patient/Family's Response to care:  Very amenable to short term rehab.  Patient/Family's Understanding of and Emotional Response to Diagnosis, Current Treatment, and Prognosis:  Patients' son familiar with placement process and is eager for his mother to become stronger.  Emotional Assessment Appearance:  Appears older than stated age Attitude/Demeanor/Rapport:  Unable to Assess Affect (typically observed):    Orientation:  Oriented to Self, Oriented to Place Alcohol / Substance use:  Not Applicable Psych involvement (Current and /or in the community):  No (Comment)  Discharge Needs  Concerns to be addressed:  Discharge Planning Concerns Readmission within the last 30 days:  No Current discharge risk:  None Barriers to Discharge:  No Barriers Identified   York SpanielMonica Earle Burson, LCSW 03/19/2015, 11:32 AM

## 2015-03-19 NOTE — Progress Notes (Signed)
Pt is sleepy and lethargic.. Able to follow  Commands. Had 3 small loose stools durning this shift. No c/o pain or nausea.. Slept well durning night. A fib on Cm. Rate is controlled in 80's.   sao2 99%. Resting comfortably in bed without any distress.

## 2015-03-19 NOTE — Progress Notes (Signed)
Pt had episode of GI bleeding overnight, not a candidate for EGD.  Increased lethargy this AM, PT recommendation is SNF rehab, CSW aware.  Jim Likeeri Daneka Lantigua RN CCM [facility]

## 2015-03-19 NOTE — Progress Notes (Signed)
Physical Therapy Treatment Patient Details Name: Madison Montoya MRN: [id number] DOB: 04-May-1932 Today's Date: 03/19/2015    History of Present Illness presented to ER with SOB, AMS and nausea/vomiting; admitted with UTI.  Also noted with elevated troponin (demand ischemia, peak at 0.04) and A-fib with RVR (rates up to 160s), requiring cardiem drip.  Hospital course signicificant for unresponsive episode due to bradycardia and vagal response.    PT Comments    Patient with increased lethargy/fatigue this date; limiting participation/progress with skilled PT services.  Was able to initiate OOB to chair with RW, min assist.  Impaired balance, activity tolerance noted; unsafe to complete without +1 and RW at this time.  Vitals stable and WFL throughout treatment. Given impaired balance and constant assist required, recommend transition to STR upon discharge; RN, care management informed/aware.   Follow Up Recommendations  SNF     Equipment Recommendations       Recommendations for Other Services       Precautions / Restrictions Precautions Precautions: Fall Precaution Comments: cleared for OOB as tolerated per RN Restrictions Weight Bearing Restrictions: No    Mobility  Bed Mobility Overal bed mobility: Needs Assistance Bed Mobility: Supine to Sit     Supine to sit: Mod assist     General bed mobility comments: mod assist for LE management and truncal elevation  Transfers Overall transfer level: Needs assistance Equipment used: Rolling walker (2 wheeled) Transfers: Sit to/from Stand Sit to Stand: Min assist            Ambulation/Gait Ambulation/Gait assistance: Min assist Ambulation Distance (Feet): 4 Feet Assistive device: Rolling walker (2 wheeled)       General Gait Details: short, shuffling steps with forward trunk flexion; increased posterior weight shift.  Step by step cuing for task initiation and sequencing. Unsafe to complete without +1 and RW at this  time.   Stairs            Wheelchair Mobility    Modified Rankin (Stroke Patients Only)       Balance Overall balance assessment: Needs assistance Sitting-balance support: No upper extremity supported Sitting balance-Leahy Scale: Fair Sitting balance - Comments: min/mod assist for initial sitting balance, progressing to close sup once upright   Standing balance support: Bilateral upper extremity supported Standing balance-Leahy Scale: Fair Standing balance comment: min assist with RW                    Cognition Arousal/Alertness: Lethargic   Overall Cognitive Status: Impaired/Different from baseline                 General Comments: requires mod/max cuing from therapist to maintain alertness this date    Exercises Other Exercises Other Exercises: Unable to maintain alertness for adequate participation with skilled therex this date    General Comments        Pertinent Vitals/Pain Pain Assessment: No/denies pain (no clinical indicators of pain noted)    Home Living                      Prior Function            PT Goals (current goals can now be found in the care plan section) Acute Rehab PT Goals Patient Stated Goal: patient unable to verbalize/comprehend; per family, to return home with family PT Goal Formulation: With patient/family Time For Goal Achievement: 04/01/15 Potential to Achieve Goals: Fair Additional Goals Additional Goal #1: Basic transfers with LRAD, sup,  for access to all seating surfaces in home environment. Additional Goal #2: Patient to mobilize >50' with LRAD, sup, for safe household mobilization upon discharge. Progress towards PT goals: Not progressing toward goals - comment (fatighe/lethargy limit parogress and participation this date)    Frequency  Min 2X/week    PT Plan Discharge plan needs to be updated    Co-evaluation             End of Session Equipment Utilized During Treatment: Gait  belt Activity Tolerance: Patient limited by lethargy Patient left: in chair;with call bell/phone within reach;with chair alarm set     Time: [id number] PT Time Calculation (min) (ACUTE ONLY): 18 min  Charges:  $Therapeutic Activity: 8-22 mins                    G Codes:     Madison Montoya, PT, DPT 03/19/2015, 2:03 PM [phone]

## 2015-03-19 NOTE — Progress Notes (Signed)
Madison Montoya remains confused. She was drowsy most of the morning but became more alert throughout the day. Got up to chair with PT. Hypotensive this afternoon. 500ml bolus given , and started on maintenance fluids. HGB was 7.3 this morning. One unit of PRBC's is being administered now. No signs of active bleeding. Last bloody stool was lastnight.  Nuclear med advised that unless pt is not actively bleeding, bleeding scan should be on hold. Dr. Cherlynn KaiserSainani aware. No new orders. Family at bedside. HR being controlled with PO cardizem and metop

## 2015-03-19 NOTE — Progress Notes (Signed)
Patient: Madison Montoya / Admit Date: 03/16/2015 / Date of Encounter: 03/19/2015, 8:02 AM   Subjective: Relatively nonverbal this AM. Heart rate around  Low 100s.  Back on metoprolol currently.  Echo had to be stopped early 2/2 chest pain. Showed EF 55-60%.  Eating very little, now on clear liquid diet.  Drop in HCT today, more lower GI bleeding yesterday and last night   Review of Systems: Review of Systems : from yesterday note Patient not providing ROS today, nonverbal but alert Constitutional: Positive for weight loss and malaise/fatigue. Negative for fever, chills and diaphoresis.  Eyes: Positive for redness.  Respiratory: Positive for shortness of breath. Negative for cough, hemoptysis, sputum production and wheezing.   Cardiovascular: Positive for chest pain. Negative for palpitations, orthopnea, claudication, leg swelling and PND.  Gastrointestinal: Negative for nausea, vomiting and abdominal pain.  Genitourinary: Positive for dysuria, urgency and frequency. Negative for hematuria and flank pain.  Neurological: Positive for weakness.    Objective: Telemetry: a-fib, 80s Physical Exam: Blood pressure 135/76, pulse 103, temperature 97.6 F (36.4 C), temperature source Oral, resp. rate 26, height  (1.575 m), weight 57.3 kg (126 lb 5.2 oz), SpO2 100 %. Body mass index is 23.1 kg/(m^2). General: Well developed, well nourished, in no acute distress. Head: Normocephalic, atraumatic, sclera non-icteric, no xanthomas, nares are without discharge. Neck: Negative for carotid bruits. JVP not elevated. Lungs: Clear bilaterally to auscultation without wheezes, rales, or rhonchi. Breathing is unlabored. Heart: Irregularly irregular, S1 S2 without murmurs, rubs, or gallops.  Abdomen: Soft, non-tender, non-distended with normoactive bowel sounds. No rebound/guarding. Extremities: No clubbing or cyanosis. No edema. Distal pedal pulses are 2+ and equal bilaterally. Neuro: Alert and  oriented X 3. Moves all extremities spontaneously. Psych:  Responds to questions appropriately with a normal affect.   Intake/Output Summary (Last 24 hours) at 03/19/15 0802 Last data filed at 03/18/15 2200  Gross per 24 hour  Intake      0 ml  Output    280 ml  Net   -280 ml    Inpatient Medications:  . calcium-vitamin D  1 tablet Oral [id number]  . enalapril  5 mg Oral BID  . insulin aspart  0-9 Units Subcutaneous TID WC  . levofloxacin (LEVAQUIN) IV  250 mg Intravenous Q24H  . metoprolol  25 mg Oral 3 times per day  . simvastatin  20 mg Oral QPM  . sodium chloride  3 mL Intravenous Q12H  . Warfarin - Pharmacist Dosing Inpatient   Does not apply q1800  warfarin suspended this AM.  Infusions: none    Labs:  Recent Labs  03/16/15 1108  03/17/15 0719 03/18/15 0451  NA 138  --  141 133*  K 3.7  --  3.5 3.6  CL 106  --  108 101  CO2 21*  --  24 24  GLUCOSE 150*  --  127* 147*  BUN 21*  --  17 14  CREATININE 1.09*  < > 1.01* 1.05*  CALCIUM 10.1  --  9.3 9.0  MG 1.7  --   --   --   < > = values in this interval not displayed.  Recent Labs  03/16/15 1108  AST 31  ALT 15  ALKPHOS 42  BILITOT 0.7  PROT 6.5  ALBUMIN 3.9    Recent Labs  03/16/15 1108  03/17/15 0719 03/18/15 0451  03/18/15 1646 03/19/15 0142  WBC 12.0*  < > 15.1* 17.1*  --   --   --  NEUTROABS 8.2*  --   --   --   --   --   --   HGB 11.0*  < > 9.7* 9.7*  < > 8.5* 7.3*  HCT 33.4*  < > 29.6* 29.3*  --   --   --   MCV 96.1  < > 95.0 95.3  --   --   --   PLT 332  < > 308 321  --   --   --   < > = values in this interval not displayed.  Recent Labs  03/16/15 1108 03/16/15 1550  TROPONINI 0.04* 0.04*   Invalid input(s): POCBNP  Recent Labs  03/16/15 2135  HGBA1C 6.0     Weights: Filed Weights   03/16/15 2050 03/17/15 0529 03/19/15 0541  Weight: 57.7 kg (127 lb 3.3 oz) 57.2 kg (126 lb 1.7 oz) 57.3 kg (126 lb 5.2 oz)     Radiology/Studies:  Ct Abdomen Pelvis W Contrast  03/16/2015    CLINICAL DATA:  Foul-smelling urine, nausea and vomiting  EXAM: CT ABDOMEN AND PELVIS WITH CONTRAST  TECHNIQUE: Multidetector CT imaging of the abdomen and pelvis was performed using the standard protocol following bolus administration of intravenous contrast.  CONTRAST:  80mL OMNIPAQUE IOHEXOL 300 MG/ML  SOLN  COMPARISON:  None.  FINDINGS: Lung bases demonstrate some mild emphysematous changes.  The liver, gallbladder, spleen, adrenal glands and pancreas are within normal limits. Small sliding-type hiatal hernia is noted.  The kidneys are well visualized bilaterally. Cortical thinning is noted. The left kidney is somewhat smaller than the right. No obstructive changes or renal calculi are noted.  Diffuse aortoiliac calcifications are seen. The appendix is within normal limits. The bladder is well distended. No pelvic mass lesion is seen. Diverticular change of the colon is noted without diverticulitis. Prominent right ovary is noted with some central calcifications. Calcification was noted on a prior exam from 2013. Postsurgical changes are noted in the proximal left femur consistent with prior fracture and fixation. Degenerative changes of the lumbar spine are seen. A mild scoliosis of the lumbar spine is noted. No compression deformities are seen.  IMPRESSION: Diverticulosis without diverticulitis.  Changes in the right adnexal region which may represent an old dermoid. The calcification is stable from 2013  No acute abnormality is noted.   Electronically Signed   By: Alcide CleverMark  Lukens M.D.   On: 03/16/2015 15:44      Assessment and Plan  81 y.o. female with history of dementia and chronic atrial fibrillation presented on 5/8 with atrial fibrillation/RVR and UTI.   1. Chronic atrial fibrillation with RVR:  -Suspect that the rapid rate at admission was driven by the UTI.  -Suspect the atrial fibrillation will settle down with treatment of UTI. Episode on the morning of admission with fall in HR to 40s  (atrial fibrillation) and hypotension was likely a vagal event.  -----rate now elevated likely from anemia - Continue metoprolol 25 mg bid As rate is mildly elevated and adequate BP, will start diltiazem 30 mg Q6 with hold parameters --coumadin on hold --asa held  - Limited echo showed normal EF, had to be stopped 2/2 chest pain. Can repeat as outpatient if needed  2. LGIB: Bleed 2 days ago, seen by Markham JordanElliot, no colo More yesterday and last night Warfarin on hold, may need PRBC transfusion   3. UTI:  Treating per primary service with levofloxacin.   3. Elevated troponin:  Very mild with flat plateau. This was in the  setting of atrial fibrillation with RVR and likely represents demand ischemia. Would hold off on ischemic evaluation unless clinical picture changes.      Signed, Dossie Arbourim Toriann Spadoni, M.D., Ph.D.

## 2015-03-19 NOTE — Clinical Documentation Improvement (Signed)
   Supporting Information:  Admitted with A Fib w/RVR  5/9 progress note:  Gastrointestinal: Positive for blood in stool. * Bloody stools - ?? Hemorrhoidal (vs) Diverticular bleeding.  - Hg. Stable and will monitor. Hold coumadin and follow serial Hg.  - if Hg < 7 will transfuse. GI consult.    5/9 GI consult note: Patient has a history of atrial fibrillation and is on Coumadin and was noted to have bright red blood per rectum she also had an elevated heart rate of 166 and was treated for this and the heart rate went down to 40. At this time plan to treat conservatively and transfuse as needed 5/10 progress note: Drop in HCT today, more lower GI bleeding yesterday and last night   Problem list: Blood loss anemia  Labs Component     Latest Ref Rng 03/16/2015 03/16/2015 03/17/2015 03/18/2015 03/18/2015        11:08 AM  9:35 PM   4:51 AM 12:07 PM  Hemoglobin     12.0 - 16.0 g/dL 78.211.0 (L) 9.6 (L) 9.7 (L) 9.7 (L) 8.9 (L)   Component     Latest Ref Rng 03/18/2015 03/19/2015         4:46 PM   Hemoglobin     12.0 - 16.0 g/dL  8.5 (L) 7.3 (L)   Treatment Monitoring Hemoglobin daily GI consult  Per problem list patient has blood loss anemia, after study please clarify acuity in next progress note and discharge summary  Acute blood loss anemia Acute on Chronic blood loss anemia Chronic blood loss anemia Other Cannot be determined  Thank You, Jolea Dolle T. Luiz OchoaWilliams RN, MSN, MBA/MHA Clinical Documentation Specialist [email or web address] Office # [phone]

## 2015-03-19 NOTE — Progress Notes (Signed)
[facility] [place] [facility]   PATIENT NAME: Madison EdwardsMarie Legacy    MR#:  [id number]  DATE OF BIRTH:  25-May-1932  SUBJECTIVE:  CHIEF COMPLAINT:   Chief Complaint  Patient presents with  . Chest Pain  . Shortness of Breath  . Altered Mental Status   Had some bloody stools overnight but none this a.m. A bit more lethargic today.  Hg. Down to 7.3 today.  Off cardizem gtt. Family at bedside.     REVIEW OF SYSTEMS:    Review of Systems  Constitutional: Negative for fever and chills.  HENT: Negative for congestion and tinnitus.   Eyes: Negative for blurred vision and double vision.  Respiratory: Negative for cough, shortness of breath and wheezing.   Cardiovascular: Negative for chest pain, orthopnea and PND.  Gastrointestinal: Positive for blood in stool. Negative for nausea, vomiting, abdominal pain and diarrhea.  Genitourinary: Negative for dysuria, frequency and hematuria.  Neurological: Positive for weakness. Negative for dizziness, tremors, sensory change and focal weakness.  All other systems reviewed and are negative.   Nutrition: Clear liquid Tolerating Diet: Yes but very little.  Tolerating PT: Yes      DRUG ALLERGIES:  No Known Allergies  VITALS:  Blood pressure 102/61, pulse 92, temperature 97.6 F (36.4 C), temperature source Oral, resp. rate 20, height 5\' 2"  (1.575 m), weight 57.3 kg (126 lb 5.2 oz), SpO2 100 %.  PHYSICAL EXAMINATION:   Physical Exam  GENERAL:  81 y.o.-year-old pale appearing female lying in the bed with no acute distress.  EYES: Pupils equal, round, reactive to light and accommodation. No scleral icterus. Extraocular muscles intact. Pale conjunctiva.  HEENT: Head atraumatic, normocephalic. Oropharynx and nasopharynx clear.  NECK:  Supple, no jugular venous distention. No thyroid enlargement, no tenderness.  LUNGS: Normal breath sounds bilaterally, no wheezing, rales,rhonchi.  No use of accessory muscles of  respiration.  CARDIOVASCULAR: Irregular rate, No Gallops, rubs, clicks.   ABDOMEN: Soft, nontender, nondistended. Hypoactive BS. No organomegaly or mass.  EXTREMITIES: No cyanosis, clubbing or edema b/l.    NEUROLOGIC: Cranial nerves II through XII are intact. No focal Motor or sensory deficits b/l.  Globally weak PSYCHIATRIC: The patient is alert and oriented x 1.  Lethargic SKIN: No obvious rash, lesion, or ulcer.    LABORATORY PANEL:   CBC  Recent Labs Lab 03/18/15 0451  03/19/15 0142  WBC 17.1*  --   --   HGB 9.7*  < > 7.3*  HCT 29.3*  --   --   PLT 321  --   --   < > = values in this interval not displayed. ------------------------------------------------------------------------------------------------------------------  Chemistries   Recent Labs Lab 03/16/15 1108  03/18/15 0451  NA 138  < > 133*  K 3.7  < > 3.6  CL 106  < > 101  CO2 21*  < > 24  GLUCOSE 150*  < > 147*  BUN 21*  < > 14  CREATININE 1.09*  < > 1.05*  CALCIUM 10.1  < > 9.0  MG 1.7  --   --   AST 31  --   --   ALT 15  --   --   ALKPHOS 42  --   --   BILITOT 0.7  --   --   < > = values in this interval not displayed. ------------------------------------------------------------------------------------------------------------------  Cardiac Enzymes  Recent Labs Lab 03/16/15 1550  TROPONINI 0.04*   ------------------------------------------------------------------------------------------------------------------  RADIOLOGY:  No results found.  ASSESSMENT AND PLAN:   81 yo female w/ PMH of Dementia, HTN, Type II DM, hx of left hip fracture in 2013, who presented to the hospital due to a. Fib w/ RVR.   * A. Fib w/ RVR - pt. Presented to the hospital w/ HR in 160's.  - off cardizem gtt now.  Cont. Oral Metoprolol, Cardizem and rate controlled now.   - appreciate Cards input.  - was on coumadin but on hold due to GI bleed presently.  - Echo done showing normal EF of 55 to 60%.   *  Elevated Troponin - likely in the setting of demand ischemia from A. Fib w/ RVR - cont. B-blocker, Statin.  Echo showing normal EF w/ no wall motion abnormalities.   - Cards following   * UTI - cont. IV Levaquin.  - urine culture (-) so far.   * Dementia w/ behavioral disturbance - cont. PRN haldol.  - avoid Benzo's.   * Bloody stools/GI bleed - Hg. Has dropped to 7.3 today.  - ?? Hemorrhoidal (vs) Diverticular bleeding.  - discussed w/ Dr. Mechele CollinElliott and will get GI bleeding scan today.  - transfuse 1 units of PRBC's and follow Hg. Cont. To hold coumadin.  - as per GI no plans on urgent colonoscopy or endoscopy given her comorbidities.   * HTN - cont. Enalapril, Metoprolol.   * DM - SSI.    All the records are reviewed and case discussed with Care Management/Social Workerr. Management plans discussed with the patient, family and they are in agreement.  CODE STATUS: Full  DVT Prophylaxis: pt. On Coumadin  TOTAL TIME TAKING CARE OF THIS PATIENT: 35 minutes.   D/c home w/ home health when GI bleed stops and clinically stable.     [place] SirenSAINANI,Lundon Verdejo J M.D on 03/19/2015 at 2:12 PM  Between 7am to 6pm - Pager - [phone]  After 6pm go to [email or web address] - password [email or web address] [facility]  [facility]  Office  [phone]  CC: Primary care physician; [facility]INIC

## 2015-03-19 NOTE — Consult Note (Signed)
Pt has not had any active bleeding today and therefore did not get bleeding scan.  Hgb this morning was 7.3,  She had 3 bloody stools last night.  BP 102/60, P 92, T 97.6  Discussed briefly with another son that no colonoscopy planned.

## 2015-03-20 DIAGNOSIS — I10 Essential (primary) hypertension: Secondary | ICD-10-CM

## 2015-03-20 LAB — CBC
HCT: 26.1 % — ABNORMAL LOW (ref 35.0–47.0)
Hemoglobin: 8.5 g/dL — ABNORMAL LOW (ref 12.0–16.0)
MCH: 28.3 pg (ref 26.0–34.0)
MCHC: 32.5 g/dL (ref 32.0–36.0)
MCV: 87.2 fL (ref 80.0–100.0)
Platelets: 277 10*3/uL (ref 150–440)
RBC: 2.99 MIL/uL — ABNORMAL LOW (ref 3.80–5.20)
RDW: 21.8 % — ABNORMAL HIGH (ref 11.5–14.5)
WBC: 11.2 10*3/uL — ABNORMAL HIGH (ref 3.6–11.0)

## 2015-03-20 LAB — GLUCOSE, CAPILLARY
GLUCOSE-CAPILLARY: 115 mg/dL — AB (ref 70–99)
GLUCOSE-CAPILLARY: 131 mg/dL — AB (ref 70–99)
Glucose-Capillary: 109 mg/dL — ABNORMAL HIGH (ref 70–99)
Glucose-Capillary: 120 mg/dL — ABNORMAL HIGH (ref 70–99)

## 2015-03-20 LAB — MAGNESIUM: Magnesium: 1.9 mg/dL (ref 1.7–2.4)

## 2015-03-20 LAB — BASIC METABOLIC PANEL
Anion gap: 5 (ref 5–15)
BUN: 16 mg/dL (ref 6–20)
CHLORIDE: 108 mmol/L (ref 101–111)
CO2: 24 mmol/L (ref 22–32)
Calcium: 8.5 mg/dL — ABNORMAL LOW (ref 8.9–10.3)
Creatinine, Ser: 0.97 mg/dL (ref 0.44–1.00)
GFR, EST NON AFRICAN AMERICAN: 53 mL/min — AB (ref >60)
GLUCOSE: 128 mg/dL — AB (ref 65–99)
POTASSIUM: 4.2 mmol/L (ref 3.5–5.1)
Sodium: 137 mmol/L (ref 135–145)

## 2015-03-20 LAB — PROTIME-INR
INR: 3.51
Prothrombin Time: 35.2 seconds — ABNORMAL HIGH (ref 11.4–15.0)

## 2015-03-20 MED ORDER — METOPROLOL TARTRATE 1 MG/ML IV SOLN: 2.5000 mg | Freq: Four times a day (QID) | INTRAVENOUS | Status: DC

## 2015-03-20 MED ORDER — METOPROLOL TARTRATE 1 MG/ML IV SOLN
INTRAVENOUS | Status: AC
  Administered 2015-03-20: 5 mg via INTRAVENOUS
  Filled 2015-03-20: qty 5

## 2015-03-20 MED ORDER — METOPROLOL TARTRATE 1 MG/ML IV SOLN
5.0000 mg | INTRAVENOUS | Status: DC | PRN
  Administered 2015-03-20 (×2): 5 mg via INTRAVENOUS
  Filled 2015-03-20: qty 5

## 2015-03-20 MED ORDER — FAMOTIDINE 20 MG PO TABS
40.0000 mg | ORAL_TABLET | Freq: Every day | ORAL | Status: DC
  Administered 2015-03-20 – 2015-03-24 (×4): 40 mg via ORAL
  Filled 2015-03-20 (×4): qty 2

## 2015-03-20 MED ORDER — DIGOXIN 250 MCG PO TABS
0.2500 mg | ORAL_TABLET | Freq: Every day | ORAL | Status: DC
  Administered 2015-03-20 – 2015-03-21 (×2): 0.25 mg via ORAL
  Filled 2015-03-20 (×2): qty 1

## 2015-03-20 MED ORDER — CHLORHEXIDINE GLUCONATE 0.12 % MT SOLN
15.0000 mL | Freq: Two times a day (BID) | OROMUCOSAL | Status: DC
  Administered 2015-03-20 – 2015-03-25 (×9): 15 mL via OROMUCOSAL

## 2015-03-20 MED ORDER — CETYLPYRIDINIUM CHLORIDE 0.05 % MT LIQD
7.0000 mL | Freq: Two times a day (BID) | OROMUCOSAL | Status: DC
  Administered 2015-03-22 – 2015-03-24 (×4): 7 mL via OROMUCOSAL

## 2015-03-20 NOTE — Evaluation (Signed)
Clinical/Bedside Swallow Evaluation Patient Details  Name: Madison Montoya MRN: [id number] Date of Birth: [birth year]  Today's Date: 03/20/2015 Time: SLP Start Time (ACUTE ONLY): 1130 SLP Stop Time (ACUTE ONLY): 1230 SLP Time Calculation (min) (ACUTE ONLY): 60 min  Past Medical History:  Past Medical History  Diagnosis Date  . Hypertension   . Diabetes mellitus without complication   . Dementia   . Arthritis    Past Surgical History:  Past Surgical History  Procedure Laterality Date  . Fracture surgery     HPI:      Assessment / Plan / Recommendation Clinical Impression  Pt appeared to safely tolerate single trials of thin liquids via cup and straw(drinking slowly and taking smal sips); no oropharyngeal phase defiicits noted as well w/ trials of puree. Pt appears to present w/ min. increased risk for aspiration sec. to her Cognitive decline and declined medical status at this time. No changes noted in her respiratory status during po tirals given w/ SLP following general aspiration precautions. Pt was able to safely tolerate Meds(pills) whole in tsp of Puree w/ SLP/NSG.    Aspiration Risk  Mild (d/t Cognitive status)    Diet Recommendation Thin;Dysphagia 3 (Mech soft);Other (Comment) (when GI upgrades diet)   Medication Administration: Whole meds with puree Compensations: Slow rate;Small sips/bites    Other  Recommendations Oral Care Recommendations: Oral care BID   Follow Up Recommendations       Frequency and Duration    1 week   Pertinent Vitals/Pain denied    SLP Swallow Goals     Swallow Study Prior Functional Status       General Date of Onset: 03/20/15 Type of Study: Bedside swallow evaluation Previous Swallow Assessment: none indicated Diet Prior to this Study: Regular;Thin liquids;Other (Comment) (intermittent coughing noted when drinking liquids per Son's report(by NSG)) Temperature Spikes Noted: No Respiratory Status: Supplemental O2 delivered via  (comment) History of Recent Intubation: No Behavior/Cognition: Alert;Cooperative;Requires cueing Oral Cavity - Dentition: Adequate natural dentition/normal for age Self-Feeding Abilities: Needs assist;Needs set up Patient Positioning: Upright in bed Baseline Vocal Quality: Normal;Low vocal intensity Volitional Cough: Strong Volitional Swallow: Able to elicit    Oral/Motor/Sensory Function Overall Oral Motor/Sensory Function: Appears within functional limits for tasks assessed   Ice Chips Ice chips: Within functional limits Presentation: Spoon Other Comments: x5   Thin Liquid Thin Liquid: Within functional limits Presentation: Cup;Straw Other Comments: x6 w/ cup; x3 w/ straw    Nectar Thick Nectar Thick Liquid: Not tested   Honey Thick Honey Thick Liquid: Not tested   Puree Puree: Within functional limits Presentation: Spoon Other Comments: x4 trials   Solid   GO    Solid: Not tested Other Comments:  (clear liquid diet at this time per GI)       Madison Montoya 03/20/2015,4:32 PM

## 2015-03-20 NOTE — Progress Notes (Addendum)
[facility] [place] [facility]   PATIENT NAME: Madison EdwardsMarie Montoya    MR#:  [id number]  DATE OF BIRTH:  1932/06/10  SUBJECTIVE:  CHIEF COMPLAINT:   Chief Complaint  Patient presents with  . Chest Pain  . Shortness of Breath  . Altered Mental Status   No bloody stools overnight.  Hg. Improved post-transfusion. Having some coughing spells while eating and therefore made NPO except meds.  HR slightly elevated.   REVIEW OF SYSTEMS:    Review of Systems  Constitutional: Negative for fever and chills.  HENT: Negative for congestion and tinnitus.   Eyes: Negative for blurred vision and double vision.  Respiratory: Negative for cough, shortness of breath and wheezing.   Cardiovascular: Positive for chest pain (musculoskeletal & reproducible). Negative for orthopnea and PND.  Gastrointestinal: Negative for nausea, vomiting, abdominal pain, diarrhea and blood in stool.  Genitourinary: Negative for dysuria and hematuria.  Neurological: Positive for weakness (generalized). Negative for dizziness, sensory change and focal weakness.  All other systems reviewed and are negative.   Nutrition: NO NPO except meds for now. Tolerating Diet: No Tolerating PT: Yes      DRUG ALLERGIES:  No Known Allergies  VITALS:  Blood pressure 167/105, pulse 90, temperature 97.6 F (36.4 C), temperature source Oral, resp. rate 20, height 5\' 2"  (1.575 m), weight 57.3 kg (126 lb 5.2 oz), SpO2 100 %.  PHYSICAL EXAMINATION:   Physical Exam  GENERAL:  81 y.o.-year-old pale appearing female lying in the bed with no acute distress.  EYES: Pupils equal, round, reactive to light and accommodation. No scleral icterus. Extraocular muscles intact. Pale conjunctiva.  HEENT: Head atraumatic, normocephalic. Oropharynx and nasopharynx clear. Dry oral mucosa  NECK:  Supple, no jugular venous distention. No thyroid enlargement, no tenderness.  LUNGS: Normal breath sounds bilaterally, no  wheezing, rales, rhonchi.  No use of accessory muscles of respiration.  CARDIOVASCULAR: Irregular rate, No Gallops, rubs, clicks.   ABDOMEN: Soft, nontender, nondistended. + BS. No organomegaly or mass.  EXTREMITIES: No cyanosis, clubbing or peripheral edema b/l.    NEUROLOGIC: Cranial nerves II through XII are intact. No focal Motor or sensory deficits b/l.  Globally weak PSYCHIATRIC: The patient is alert and oriented x 1.  Flat affect.  SKIN: No obvious rash, lesion, or ulcer.    LABORATORY PANEL:   CBC  Recent Labs Lab 03/20/15 0351  WBC 11.2*  HGB 8.5*  HCT 26.1*  PLT 277   ------------------------------------------------------------------------------------------------------------------  Chemistries   Recent Labs Lab 03/16/15 1108  03/20/15 0350  NA 138  < > 137  K 3.7  < > 4.2  CL 106  < > 108  CO2 21*  < > 24  GLUCOSE 150*  < > 128*  BUN 21*  < > 16  CREATININE 1.09*  < > 0.97  CALCIUM 10.1  < > 8.5*  MG 1.7  --  1.9  AST 31  --   --   ALT 15  --   --   ALKPHOS 42  --   --   BILITOT 0.7  --   --   < > = values in this interval not displayed. ------------------------------------------------------------------------------------------------------------------  Cardiac Enzymes  Recent Labs Lab 03/16/15 1550  TROPONINI 0.04*   ------------------------------------------------------------------------------------------------------------------  RADIOLOGY:  No results found.   ASSESSMENT AND PLAN:   81 yo female w/ PMH of Dementia, HTN, Type II DM, hx of left hip fracture in 2013, who presented to the hospital due  to a. Fib w/ RVR.   * A. Fib w/ RVR - pt. Presented to the hospital w/ HR in 160's.  - off cardizem gtt now.  NPO this a.m. Therefore did not get oral meds.  Given IV metoprolol this a.m. As per Cards.  Resume Oral metoprolol, Cardizem after speech eval.  - was on coumadin but on hold due to GI bleed/Anemia  - Echo done showing normal EF of 55 to  60%.   * Elevated Troponin - likely in the setting of demand ischemia from A. Fib w/ RVR - cont. B-blocker, Statin.  Echo showing normal EF w/ no wall motion abnormalities.   - Cards following   * UTI - cont. IV Levaquin.  - urine culture (-) so far.   * Dementia w/ behavioral disturbance - cont. PRN haldol.  - avoid Benzo's.   * Bloody stools/GI bleed - no further bleeding overnight or yesterday.  - Hg. Improved post-transfusion and will monitor.  - likely diverticular bleeding which has stopped now.  -  Will d/c bleeding scan for now and reorder if she starts to bleed again.  - Cont. To hold coumadin.  - as per GI no plans on urgent colonoscopy or endoscopy given her comorbidities.   * Anemia - acute blood loss anemia due to GI bleed - Hg. Improved post-transfusion and will monitor.  - hold coumadin.   * HTN - cont. Enalapril, Metoprolol.   * DM - SSI.   * Coughing spells while eating - await speech eval today.  - NPO except meds for now.   Transfer to tele later today if HR controlled.   All the records are reviewed and case discussed with Care Management/Social Workerr. Management plans discussed with the patient, family and they are in agreement.  CODE STATUS: Full  DVT Prophylaxis: Pt. On Coumadin.   TOTAL TIME TAKING CARE OF THIS PATIENT: 30 minutes.    [place] SirenSAINANI,VIVEK J M.D on 03/20/2015 at 12:28 PM  Between 7am to 6pm - Pager - [phone]  After 6pm go to [email or web address] - password [email or web address] [facility]  [facility] [place] [facility]  Office  [phone]  CC: Primary care physician; [facility]

## 2015-03-20 NOTE — Progress Notes (Signed)
Patient: Madison Montoya / Admit Date: 03/16/2015 / Date of Encounter: 03/20/2015, 7:39 AM   Subjective: Resting comfortably this morning. Heart rate around low 100s. Back on metoprolol 25 mg q 8 hours and diltiazem 30 mg q 6 hours currently.  CBC is pending this morning. SCr stable. Echo had to be stopped early 2/2 chest pain. Showed EF 55-60%. Eating very little, now on clear liquid diet. GI not planning for colo. Drop in HCT 5/10, more lower GI bleeding yesterday and evening of 5/9.  Review of Systems: Review of Systems  Constitutional: Positive for weight loss and malaise/fatigue. Negative for fever, chills and diaphoresis.  Eyes: Negative for discharge and redness.  Respiratory: Positive for shortness of breath. Negative for cough, hemoptysis, sputum production and wheezing.   Cardiovascular: Positive for palpitations. Negative for chest pain, orthopnea, claudication, leg swelling and PND.  Gastrointestinal: Positive for nausea and abdominal pain. Negative for vomiting, diarrhea and constipation.  Genitourinary: Positive for dysuria, urgency, frequency and flank pain. Negative for hematuria.  Musculoskeletal: Positive for myalgias and joint pain.  Skin: Negative for itching and rash.  Neurological: Positive for weakness.      Objective: Telemetry: a-fib, currently in the low 100s, had brief episode into the 150s at 2:30 AM and into the 120s at 6:30 AM Physical Exam: Blood pressure 146/65, pulse 101, temperature 97.6 F (36.4 C), temperature source Oral, resp. rate 24, height  (1.575 m), weight 126 lb 5.2 oz (57.3 kg), SpO2 100 %. Body mass index is 23.1 kg/(m^2). General: Well developed, well nourished, in no acute distress. Head: Normocephalic, atraumatic, sclera non-icteric, no xanthomas, nares are without discharge. Neck: Negative for carotid bruits. JVP not elevated. Lungs: Clear bilaterally to auscultation without wheezes, rales, or rhonchi. Breathing is  unlabored. Heart: Irregularly irregular, S1 S2 without murmurs, rubs, or gallops.  Abdomen: Soft, non-tender, non-distended with normoactive bowel sounds. No rebound/guarding. Extremities: No clubbing or cyanosis. No edema. Distal pedal pulses are 2+ and equal bilaterally. Neuro: Alert and oriented X 3. Moves all extremities spontaneously. Psych:  Responds to questions appropriately with a normal affect.   Intake/Output Summary (Last 24 hours) at 03/20/15 0739 Last data filed at 03/20/15 0600  Gross per 24 hour  Intake   1810 ml  Output   1300 ml  Net    510 ml    Inpatient Medications:  . calcium-vitamin D  1 tablet Oral BH-q7a  . digoxin  0.25 mg Oral Daily  . diltiazem  30 mg Oral 4 times per day  . enalapril  5 mg Oral BID  . insulin aspart  0-9 Units Subcutaneous TID WC  . levofloxacin (LEVAQUIN) IV  250 mg Intravenous Q24H  . metoprolol  25 mg Oral 3 times per day  . simvastatin  20 mg Oral QPM  . sodium chloride  3 mL Intravenous Q12H  warfarin suspended this AM.  Infusions: none . sodium chloride 100 mL/hr at 03/20/15 0600    Labs:  Recent Labs  03/18/15 0451 03/20/15 0350  NA 133* 137  K 3.6 4.2  CL 101 108  CO2 24 24  GLUCOSE 147* 128*  BUN 14 16  CREATININE 1.05* 0.97  CALCIUM 9.0 8.5*  MG  --  1.9   No results for input(s): AST, ALT, ALKPHOS, BILITOT, PROT, ALBUMIN in the last 72 hours.  Recent Labs  03/18/15 0451  03/18/15 1646 03/19/15 0142  WBC 17.1*  --   --   --   HGB 9.7*  < >  8.5* 7.3*  HCT 29.3*  --   --   --   MCV 95.3  --   --   --   PLT 321  --   --   --   < > = values in this interval not displayed. No results for input(s): CKTOTAL, CKMB, TROPONINI in the last 72 hours. Invalid input(s): POCBNP No results for input(s): HGBA1C in the last 72 hours.   Weights: Filed Weights   03/16/15 2050 03/17/15 0529 03/19/15 0541  Weight: 127 lb 3.3 oz (57.7 kg) 126 lb 1.7 oz (57.2 kg) 126 lb 5.2 oz (57.3 kg)     Radiology/Studies:  Ct  Abdomen Pelvis W Contrast  03/16/2015   CLINICAL DATA:  Foul-smelling urine, nausea and vomiting  EXAM: CT ABDOMEN AND PELVIS WITH CONTRAST  TECHNIQUE: Multidetector CT imaging of the abdomen and pelvis was performed using the standard protocol following bolus administration of intravenous contrast.  CONTRAST:  80mL OMNIPAQUE IOHEXOL 300 MG/ML  SOLN  COMPARISON:  None.  FINDINGS: Lung bases demonstrate some mild emphysematous changes.  The liver, gallbladder, spleen, adrenal glands and pancreas are within normal limits. Small sliding-type hiatal hernia is noted.  The kidneys are well visualized bilaterally. Cortical thinning is noted. The left kidney is somewhat smaller than the right. No obstructive changes or renal calculi are noted.  Diffuse aortoiliac calcifications are seen. The appendix is within normal limits. The bladder is well distended. No pelvic mass lesion is seen. Diverticular change of the colon is noted without diverticulitis. Prominent right ovary is noted with some central calcifications. Calcification was noted on a prior exam from 2013. Postsurgical changes are noted in the proximal left femur consistent with prior fracture and fixation. Degenerative changes of the lumbar spine are seen. A mild scoliosis of the lumbar spine is noted. No compression deformities are seen.  IMPRESSION: Diverticulosis without diverticulitis.  Changes in the right adnexal region which may represent an old dermoid. The calcification is stable from 2013  No acute abnormality is noted.   Electronically Signed   By: Alcide CleverMark  Lukens M.D.   On: 03/16/2015 15:44      Assessment and Plan  81 y.o. female with history of dementia and chronic atrial fibrillation presented on 5/8 with atrial fibrillation/RVR and UTI.   1. Chronic atrial fibrillation with RVR:  -Suspect that the rapid rate at admission was driven by the UTI.  -Suspect the atrial fibrillation will settle down with treatment of UTI and anemia.  -Intermittent  RVR likely 2/2 stress from UTI and anemia, heart rate up to 150s at 2:30 AM and 120s at 6:30 AM on 5/11. -Add po digoxin 0.25 mg daily today, decrease down to 0.125 mg daily on 5/12. -Continue metoprolol 25 mg bid and diltiazem 30 mg q 6 hours with hold parameters, stable BP. -Coumadin on hold. -ASA held. -Limited echo showed normal EF, had to be stopped 2/2 chest pain. Can repeat as outpatient if needed.  2. LGIB: -3 bloody stools 5/9 -Seen by Dr. Markham JordanElliot, no colo planned  -Warfarin on hold, s/p pRBC transfusion on 5/10 -CBC pending this AM   3. UTI:  -Treating per primary service with levofloxacin.   3. Elevated troponin:  -Very mild with flat plateau. This was in the setting of atrial fibrillation with RVR and likely represents demand ischemia. -Would hold off on ischemic evaluation unless clinical picture changes.      Elinor DodgeSigned, DUNN,RYAN 03/20/2015 7:50 AM   I have seen, examined and evaluated the  patient this AM along with Mr. Shea EvansDunn, [place]PA-C.  After reviewing all the available data and chart,  I agree with his findings, examination as well as impression recommendations.  Unfortunately - due to choking - failed swallow study - now NPO.  HR poorly controlled off of PO meds. Need to convert back to PO -- can use Metoprolol 2.5 mg q6 hr standing as well as 2.5-5 mg IV PRN HR>120 (provided BP wll tolerate)> Could also go back on Diltiazem gtt to allow titration for rate control.    Marykay LexHARDING, Chance Karam W, M.D., M.S. Interventional Cardiologist   Pager # [phone]

## 2015-03-20 NOTE — Progress Notes (Signed)
Physical Therapy Treatment Patient Details Name: Madison EdwardsMarie Montoya MRN: [id number] DOB: 1932/03/23 Today's Date: 03/20/2015    History of Present Illness presented to ER with SOB, AMS and nausea/vomiting; admitted with UTI.  Also noted with elevated troponin (demand ischemia, peak at 0.04) and A-fib with RVR (rates up to 160s), requiring cardiem drip.  Hospital course signicificant for unresponsive episode due to bradycardia and vagal response.    PT Comments    Marked improvement in alertness, level of participation and overall functional performance this date.  Able to initiate gait distance for short-distances (40') with RW, min assist for balance and walker management.  Additional distance limited by fatigue.  HR stable and WFL 80-90s throughout session.   Follow Up Recommendations  SNF     Equipment Recommendations       Recommendations for Other Services       Precautions / Restrictions Precautions Precautions: Fall Restrictions Weight Bearing Restrictions: No    Mobility  Bed Mobility Overal bed mobility: Needs Assistance Bed Mobility: Supine to Sit     Supine to sit: Min assist        Transfers Overall transfer level: Needs assistance Equipment used: Rolling walker (2 wheeled) Transfers: Sit to/from Stand Sit to Stand: Min assist         General transfer comment: cuing for hand placement and overall safety with movement transition  Ambulation/Gait Ambulation/Gait assistance: Min assist Ambulation Distance (Feet): 40 Feet Assistive device: Rolling walker (2 wheeled)       General Gait Details: reciprocal stepping with decreased step height/length, forward trunk flexion; min assist for walker management to guide path and negotiate obstacles.  HR stable and WFL throughout activity (80-90bpm); additional distance limited by fatigue.   Stairs            Wheelchair Mobility    Modified Rankin (Stroke Patients Only)       Balance                                    Cognition Arousal/Alertness: Awake/alert Behavior During Therapy: WFL for tasks assessed/performed Overall Cognitive Status: Impaired/Different from baseline Area of Impairment: Orientation;Memory Orientation Level: Disoriented to;Time;Situation   Memory: Decreased short-term memory Following Commands: Follows one step commands consistently       General Comments: significant improvement in alertness and task/movement initiation this date    Exercises Other Exercises Other Exercises: Toilet transfer, SPT with RW, cga/min assist. Min cuing for walker negotiation and task sequencing. Standing balance for hygiene and clothing management, cga/close sup with RW.    General Comments        Pertinent Vitals/Pain Pain Assessment: No/denies pain    Home Living                      Prior Function            PT Goals (current goals can now be found in the care plan section) Acute Rehab PT Goals Patient Stated Goal: patient unable to verbalize/comprehend; per family, to return home with family PT Goal Formulation: With patient/family Time For Goal Achievement: 04/01/15 Potential to Achieve Goals: Fair Additional Goals Additional Goal #1: Basic transfers with LRAD, sup, for access to all seating surfaces in home environment. Additional Goal #2: Patient to mobilize >50' with LRAD, sup, for safe household mobilization upon discharge. Progress towards PT goals: Progressing toward goals    Frequency  Min  2X/week    PT Plan      Co-evaluation             End of Session Equipment Utilized During Treatment: Gait belt Activity Tolerance: Patient tolerated treatment well Patient left: in chair;with call bell/phone within reach;with chair alarm set;with family/visitor present     Time: 9528-41321354-1421 PT Time Calculation (min) (ACUTE ONLY): 27 min  Charges:  $Gait Training: 8-22 mins $Therapeutic Activity: 8-22 mins                     G Codes:     Oveta Idris H. Manson PasseyBrown, PT, DPT 03/20/2015, 4:56 PM [phone]

## 2015-03-20 NOTE — Progress Notes (Signed)
Pt is alert, oriented only to self, calm and cooperative.  Pt is a-fib, rate controlled this afternoon once po meds resumed.  Pt had episode at breakfast this am with coughing with liquids, pt was npo and speech eval placed, Dr. Cherlynn KaiserSainani was notified, [facility].  Pt passed swallow eval.  VSS, afebrile.  [facility]  Pt to be transferred to 2A when bed available.

## 2015-03-20 NOTE — Consult Note (Signed)
Pt without bleeding,  No bloody stools.  VSS BP up a bit. hgb 8.5 plt 277K.  Abd neg, chest clear.  Poor communication.  I will sign off reconsult if needed

## 2015-03-21 DIAGNOSIS — I4891 Unspecified atrial fibrillation: Secondary | ICD-10-CM

## 2015-03-21 LAB — CBC
HCT: 27.1 % — ABNORMAL LOW (ref 35.0–47.0)
HEMOGLOBIN: 9.1 g/dL — AB (ref 12.0–16.0)
MCH: 29 pg (ref 26.0–34.0)
MCHC: 33.6 g/dL (ref 32.0–36.0)
MCV: 86.5 fL (ref 80.0–100.0)
PLATELETS: 347 10*3/uL (ref 150–440)
RBC: 3.13 MIL/uL — AB (ref 3.80–5.20)
RDW: 20.8 % — ABNORMAL HIGH (ref 11.5–14.5)
WBC: 17.1 10*3/uL — ABNORMAL HIGH (ref 3.6–11.0)

## 2015-03-21 LAB — GLUCOSE, CAPILLARY
Glucose-Capillary: 143 mg/dL — ABNORMAL HIGH (ref 65–99)
Glucose-Capillary: 121 mg/dL — ABNORMAL HIGH (ref 65–99)
Glucose-Capillary: 157 mg/dL — ABNORMAL HIGH (ref 65–99)
Glucose-Capillary: 83 mg/dL (ref 65–99)

## 2015-03-21 LAB — HEMOGLOBIN: Hemoglobin: 8 g/dL — ABNORMAL LOW (ref 12.0–16.0)

## 2015-03-21 MED ORDER — LEVOFLOXACIN 250 MG PO TABS
250.0000 mg | ORAL_TABLET | Freq: Every day | ORAL | Status: DC
  Administered 2015-03-21 – 2015-03-25 (×5): 250 mg via ORAL
  Filled 2015-03-21 (×5): qty 1

## 2015-03-21 NOTE — Care Management Note (Signed)
Case Management Note  Patient Details  Name: Madison Montoya MRN: [id number] Date of Birth: Jun 28, 1932  Subjective/Objective:                    Action/Plan:   Expected Discharge Date:                  Expected Discharge Plan:     In-House Referral:     Discharge planning Services     Post Acute Care Choice:    Choice offered to:     DME Arranged:    DME Agency:     HH Arranged:    HH Agency:     Status of Service:     Medicare Important Message Given:   yes   Date Medicare IM Given:   03/21/15 Medicare IM give by:   Madison Montoya Date Additional Medicare IM Given:    Additional Medicare Important Message give by:     If discussed at Long Length of Stay Meetings, dates discussed:    Additional Comments:  Madison Montoya, Madison Semmel R, RN 03/21/2015, 3:05 PM

## 2015-03-21 NOTE — Progress Notes (Signed)
[facility] [place] [facility]   PATIENT NAME: Madison EdwardsMarie Montoya    MR#:  [id number]  DATE OF BIRTH:  03-Mar-1932  SUBJECTIVE:  CHIEF COMPLAINT:   Chief Complaint  Patient presents with  . Chest Pain  . Shortness of Breath  . Altered Mental Status   Hg. Stable.  Had one episode of bloody stool this a.m. PO intake poor.  Family at bedside.  No other complaints.    REVIEW OF SYSTEMS:    Review of Systems  Constitutional: Negative for fever and chills.  HENT: Negative for congestion and tinnitus.   Eyes: Negative for blurred vision and double vision.  Respiratory: Negative for cough, shortness of breath and wheezing.   Cardiovascular: Negative for chest pain, orthopnea, leg swelling and PND.  Gastrointestinal: Positive for blood in stool (1 episode overnight). Negative for nausea, vomiting, abdominal pain and diarrhea.  Genitourinary: Negative for dysuria and hematuria.  Neurological: Positive for weakness (generalized). Negative for dizziness, sensory change, focal weakness and headaches.  All other systems reviewed and are negative.   Nutrition: Tolerating Diet:  Clear liquid.  Tolerating PT: Yes      DRUG ALLERGIES:  No Known Allergies  VITALS:  Blood pressure 147/74, pulse 83, temperature 98.7 F (37.1 C), temperature source Oral, resp. rate 18, height 5\' 2"  (1.575 m), weight 57.199 kg (126 lb 1.6 oz), SpO2 100 %.  PHYSICAL EXAMINATION:   Physical Exam  GENERAL:  81 y.o.-year-old pale appearing female lying in the bed with no acute distress.  EYES: Pupils equal, round, reactive to light and accommodation. No scleral icterus. Extraocular muscles intact. Pale conjunctiva.  HEENT: Head atraumatic, normocephalic. Oropharynx and nasopharynx clear. Dry oral mucosa  NECK:  Supple, no jugular venous distention. No thyroid enlargement, no tenderness.  LUNGS: Normal breath sounds bilaterally, no wheezing, rales, rhonchi.  No use of accessory  muscles of respiration.  CARDIOVASCULAR: Irregular rate, No Gallops, rubs, clicks.   ABDOMEN: Soft, nontender, nondistended. + BS. No organomegaly or mass.  EXTREMITIES: No cyanosis, clubbing or peripheral edema b/l.    NEUROLOGIC: Cranial nerves II through XII are intact. No focal Motor or sensory deficits b/l.  Globally weak PSYCHIATRIC: The patient is alert and oriented x 1.  Flat affect.  SKIN: No obvious rash, lesion, or ulcer.    LABORATORY PANEL:   CBC  Recent Labs Lab 03/21/15 0523  WBC 17.1*  HGB 9.1*  HCT 27.1*  PLT 347   ------------------------------------------------------------------------------------------------------------------  Chemistries   Recent Labs Lab 03/16/15 1108  03/20/15 0350  NA 138  < > 137  K 3.7  < > 4.2  CL 106  < > 108  CO2 21*  < > 24  GLUCOSE 150*  < > 128*  BUN 21*  < > 16  CREATININE 1.09*  < > 0.97  CALCIUM 10.1  < > 8.5*  MG 1.7  --  1.9  AST 31  --   --   ALT 15  --   --   ALKPHOS 42  --   --   BILITOT 0.7  --   --   < > = values in this interval not displayed. ------------------------------------------------------------------------------------------------------------------  Cardiac Enzymes  Recent Labs Lab 03/16/15 1550  TROPONINI 0.04*   ------------------------------------------------------------------------------------------------------------------  RADIOLOGY:  No results found.   ASSESSMENT AND PLAN:   81 yo female w/ PMH of Dementia, HTN, Type II DM, hx of left hip fracture in 2013, who presented to the hospital due to  a. Fib w/ RVR.   * A. Fib w/ RVR - pt. Presented to the hospital w/ HR in 160's.  - off cardizem gtt now. - as per Cards. Cont. metoprolol, Cardizem, Dig and rates are stable.   - was on coumadin but on hold due to GI bleed/Anemia  - Echo done showing normal EF of 55 to 60%.   * Elevated Troponin - likely in the setting of demand ischemia from A. Fib w/ RVR - cont. B-blocker, Statin.   Echo showing normal EF w/ no wall motion abnormalities.   - Cards following   * UTI - on IV levaquin and will switch to Oral.   * Dementia w/ behavioral disturbance - cont. PRN haldol.  - avoid Benzo's.   * Bloody stools/GI bleed - one episode of bleeding overnight.  Hg. Stable.  - likely diverticular bleeding which has improved.  - Cont. To hold coumadin.  - as per GI no plans on urgent colonoscopy or endoscopy and they have signed off.  - will advance diet and monitor.    * Anemia - acute blood loss anemia due to GI bleed - Hg. Improved post-transfusion and stable.  - cont to hold coumadin.   * HTN - cont. Enalapril, Metoprolol.   * DM - SSI.   * Coughing spells while eating - seen by speech and started on mechanical soft diet and will monitor.   Seen by PT and they recommend SNF but daughter does not SNF and made CM aware.   All the records are reviewed and case discussed with Care Management/Social Workerr. Management plans discussed with the patient, family and they are in agreement.  CODE STATUS: Full  DVT Prophylaxis: TED/SCD's.    TOTAL TIME TAKING CARE OF THIS PATIENT: 30 minutes.    Houston SirenSAINANI,Danylle Ouk J M.D on 03/21/2015 at 1:50 PM  Between 7am to 6pm - Pager - [phone]  After 6pm go to [email or web address] - password [email or web address] [facility]  [facility]  Office  [phone]  CC: Primary care physician; [facility]

## 2015-03-21 NOTE — Progress Notes (Signed)
Physical Therapy Treatment Patient Details Name: Madison EdwardsMarie Montoya MRN: [id number] DOB: 11-25-1931 Today's Date: 03/21/2015    History of Present Illness presented to ER with SOB, AMS and nausea/vomiting; admitted with UTI.  Also noted with elevated troponin (demand ischemia, peak at 0.04) and A-fib with RVR (rates up to 160s), requiring cardiem drip.  Hospital course signicificant for unresponsive episode due to bradycardia and vagal response.    PT Comments    Patient continues to make progressive improvement in functional mobility, requiring cga/min assist with RW.  Constant cuing for safety awareness.  Patient with increased reports of pain this date, headache and soreness in chest, limiting overall activity tolerance this date.  RN informed/aware and to administer pain medication as appropriate.   Follow Up Recommendations  SNF     Equipment Recommendations       Recommendations for Other Services       Precautions / Restrictions Precautions Precautions: Fall Restrictions Weight Bearing Restrictions: No    Mobility  Bed Mobility Overal bed mobility: Needs Assistance Bed Mobility: Supine to Sit;Sit to Supine     Supine to sit: Supervision Sit to supine: Min assist      Transfers Overall transfer level: Needs assistance Equipment used: Rolling walker (2 wheeled) Transfers: Sit to/from Stand Sit to Stand: Min guard;Supervision            Ambulation/Gait Ambulation/Gait assistance: Min guard;Supervision Ambulation Distance (Feet): 50 Feet Assistive device: Rolling walker (2 wheeled)       General Gait Details: reciprocal stepping with decreased step height/length, forward trunk flexion; improved walker management and path negotiation.  HR stable and WFL throughout activity (90-100bpm); additional distance limited by fatigue.   Stairs            Wheelchair Mobility    Modified Rankin (Stroke Patients Only)       Balance                                     Cognition Arousal/Alertness: Awake/alert Behavior During Therapy: WFL for tasks assessed/performed Overall Cognitive Status: Impaired/Different from baseline Area of Impairment: Orientation;Memory Orientation Level: Disoriented to;Time;Situation   Memory: Decreased short-term memory Following Commands: Follows one step commands consistently            Exercises Other Exercises Other Exercises: Toilet transfer, ambulatory with RW, cga/close sup.  Tends to step away from walker when approaching seating surfaces.  Standing balance for hygiene and clothing management, cga/close sup. Other Exercises: Supine LE therex, 1x10, AROM for muscular strength/endurance: ankle pumps, SAQs, heel slides.  Patient declined additional activity due to fatigue and headache.    General Comments        Pertinent Vitals/Pain Pain Assessment: Faces Pain Score: 6  Faces Pain Scale: Hurts even more Pain Location: headache, chest Pain Descriptors / Indicators: Constant;Aching Pain Intervention(s): Premedicated before session;Limited activity within patient's tolerance;Patient requesting pain meds-RN notified;Repositioned    Home Living                      Prior Function            PT Goals (current goals can now be found in the care plan section) Acute Rehab PT Goals Patient Stated Goal: patient unable to verbalize/comprehend; per family, to return home with family PT Goal Formulation: With patient/family Time For Goal Achievement: 04/01/15 Potential to Achieve Goals: Fair Additional Goals Additional Goal #1:  Basic transfers with LRAD, sup, for access to all seating surfaces in home environment. Additional Goal #2: Patient to mobilize >50' with LRAD, sup, for safe household mobilization upon discharge. Progress towards PT goals: Progressing toward goals    Frequency  Min 2X/week    PT Plan Current plan remains appropriate    Co-evaluation              End of Session Equipment Utilized During Treatment: Gait belt Activity Tolerance: Patient limited by fatigue;Patient limited by pain Patient left: in bed;with call bell/phone within reach;with bed alarm set     Time: 1106-1130 PT Time Calculation (min) (ACUTE ONLY): 24 min  Charges:  $Gait Training: 8-22 mins $Therapeutic Exercise: 8-22 mins                    G Codes:      Madison Montoya, PT, DPT 03/21/2015, 12:10 PM [phone]

## 2015-03-21 NOTE — Clinical Social Work Placement (Signed)
   CLINICAL SOCIAL WORK PLACEMENT  NOTE  Date:  03/21/2015  Patient Details  Name: Madison Montoya MRN: [id number] Date of Birth: 04-23-1932  Clinical Social Work is seeking post-discharge placement for this patient at the Skilled  Nursing Facility level of care (*CSW will initial, date and re-position this form in  chart as items are completed):  Yes   Patient/family provided with [facility] Clinical Social Work Department's list of facilities offering this level of care within the geographic area requested by the patient (or if unable, by the patient's family).  Yes   Patient/family informed of their freedom to choose among providers that offer the needed level of care, that participate in Medicare, Medicaid or managed care program needed by the patient, have an available bed and are willing to accept the patient.  Yes   Patient/family informed of [facility]'s ownership interest in [facility] and [facility], as well as of the fact that they are under no obligation to receive care at these facilities.  PASRR submitted to EDS on       PASRR number received on       Existing PASRR number confirmed on 03/21/15     FL2 transmitted to all facilities in geographic area requested by pt/family on 03/20/15     FL2 transmitted to all facilities within larger geographic area on       Patient informed that his/her managed care company has contracts with or will negotiate with certain facilities, including the following:   (Yes pt was informed of Hospital contracts)     Yes   Patient/family informed of bed offers received.  Patient chooses bed at  [facility]     Physician recommends and patient chooses bed at      Patient to be transferred to  [facility] on  .  Patient to be transferred to facility by       Patient family notified on   of transfer.  Name of family member notified:        PHYSICIAN       Additional Comment:  CSW spoke to pt's son Fayrene FearingJames, and pt's  daughter and granddaughter.  All parties are in agreement that pt will DC to a SNF facility when she is medically stable.  CSW will follow to DC.     _______________________________________________ Chauncy PassyBennerson, Maci Eickholt J, LCSW 03/21/2015, 3:08 PM

## 2015-03-21 NOTE — Progress Notes (Signed)
Per CNA, patient just had large bloody stool (minimal stool but a lot of bright red blood). Dr. Cherlynn KaiserSainani was notified and orders were received: stat Hgb, NM GI Blood Loss imaging, and NPO for now (resume diet if test is negative).  Adella NissenBailey, Shalita Notte G

## 2015-03-21 NOTE — Care Management CHF Note (Signed)
REceived patient from ICU.  It has been recommended by physical therapy patient discharge to skilled nursing.  CM and CSW have been informed that family is declining this recommendation.  It has been determined that there is not appropriate inhome support to meet patient care needs.

## 2015-03-21 NOTE — Progress Notes (Signed)
Dr. Cherlynn KaiserSainani notified of patient's Hgb level. No plans to transfuse at this time. Order another Hgb to be drawn at midnight.   RN trying to contact Nursing Supervisor to see if GI bleed scan can be done tonight, if not have it done in the morning and continue to monitor pt.  Adella NissenBailey, Kerri-Anne Haeberle G

## 2015-03-22 ENCOUNTER — Encounter: Payer: Self-pay | Admitting: Physician Assistant

## 2015-03-22 DIAGNOSIS — E785 Hyperlipidemia, unspecified: Secondary | ICD-10-CM

## 2015-03-22 DIAGNOSIS — D62 Acute posthemorrhagic anemia: Secondary | ICD-10-CM

## 2015-03-22 DIAGNOSIS — Z515 Encounter for palliative care: Secondary | ICD-10-CM

## 2015-03-22 DIAGNOSIS — I2510 Atherosclerotic heart disease of native coronary artery without angina pectoris: Secondary | ICD-10-CM

## 2015-03-22 DIAGNOSIS — F329 Major depressive disorder, single episode, unspecified: Secondary | ICD-10-CM

## 2015-03-22 DIAGNOSIS — Z87310 Personal history of (healed) osteoporosis fracture: Secondary | ICD-10-CM

## 2015-03-22 DIAGNOSIS — I4891 Unspecified atrial fibrillation: Secondary | ICD-10-CM

## 2015-03-22 DIAGNOSIS — R4182 Altered mental status, unspecified: Secondary | ICD-10-CM

## 2015-03-22 DIAGNOSIS — F0390 Unspecified dementia without behavioral disturbance: Secondary | ICD-10-CM

## 2015-03-22 DIAGNOSIS — Z7901 Long term (current) use of anticoagulants: Secondary | ICD-10-CM

## 2015-03-22 DIAGNOSIS — M818 Other osteoporosis without current pathological fracture: Secondary | ICD-10-CM

## 2015-03-22 DIAGNOSIS — E119 Type 2 diabetes mellitus without complications: Secondary | ICD-10-CM

## 2015-03-22 LAB — BASIC METABOLIC PANEL
Anion gap: 8 (ref 5–15)
BUN: 16 mg/dL (ref 6–20)
CALCIUM: 9.3 mg/dL (ref 8.9–10.3)
CHLORIDE: 103 mmol/L (ref 101–111)
CO2: 24 mmol/L (ref 22–32)
Creatinine, Ser: 0.9 mg/dL (ref 0.44–1.00)
GFR calc Af Amer: >60 mL/min (ref >60)
GFR, EST NON AFRICAN AMERICAN: 58 mL/min — AB (ref >60)
Glucose, Bld: 142 mg/dL — ABNORMAL HIGH (ref 65–99)
Potassium: 3.8 mmol/L (ref 3.5–5.1)
Sodium: 135 mmol/L (ref 135–145)

## 2015-03-22 LAB — GLUCOSE, CAPILLARY
GLUCOSE-CAPILLARY: 117 mg/dL — AB (ref 65–99)
Glucose-Capillary: 160 mg/dL — ABNORMAL HIGH (ref 65–99)
Glucose-Capillary: 105 mg/dL — ABNORMAL HIGH (ref 65–99)
Glucose-Capillary: 200 mg/dL — ABNORMAL HIGH (ref 65–99)

## 2015-03-22 LAB — MAGNESIUM: Magnesium: 1.9 mg/dL (ref 1.7–2.4)

## 2015-03-22 LAB — CBC
HCT: 24.5 % — ABNORMAL LOW (ref 35.0–47.0)
Hemoglobin: 7.9 g/dL — ABNORMAL LOW (ref 12.0–16.0)
MCH: 28.3 pg (ref 26.0–34.0)
MCHC: 32.3 g/dL (ref 32.0–36.0)
MCV: 87.6 fL (ref 80.0–100.0)
Platelets: 338 10*3/uL (ref 150–440)
RBC: 2.79 MIL/uL — ABNORMAL LOW (ref 3.80–5.20)
RDW: 20.7 % — AB (ref 11.5–14.5)
WBC: 11.7 10*3/uL — ABNORMAL HIGH (ref 3.6–11.0)

## 2015-03-22 MED ORDER — ENSURE ENLIVE PO LIQD
237.0000 mL | Freq: Three times a day (TID) | ORAL | Status: DC
  Administered 2015-03-22 – 2015-03-25 (×8): 237 mL via ORAL

## 2015-03-22 MED ORDER — METOPROLOL TARTRATE 50 MG PO TABS
50.0000 mg | ORAL_TABLET | Freq: Two times a day (BID) | ORAL | Status: DC
  Administered 2015-03-22 – 2015-03-25 (×7): 50 mg via ORAL
  Filled 2015-03-22 (×7): qty 1

## 2015-03-22 MED ORDER — SODIUM CHLORIDE 0.9 % IV SOLN
INTRAVENOUS | Status: DC
  Administered 2015-03-22 – 2015-03-24 (×4): via INTRAVENOUS

## 2015-03-22 MED ORDER — DIGOXIN 125 MCG PO TABS
0.1250 mg | ORAL_TABLET | Freq: Every day | ORAL | Status: DC
  Administered 2015-03-22 – 2015-03-23 (×2): 0.125 mg via ORAL
  Filled 2015-03-22 (×3): qty 1

## 2015-03-22 NOTE — Care Management Note (Signed)
Case Management Note  Patient Details  Name: Madison Montoya MRN: [id number] Date of Birth: August 17, 1932  Subjective/Objective:                    Action/Plan:   Expected Discharge Date:                  Expected Discharge Plan:     In-House Referral:  Clinical Social Work  Discharge planning Services     Post Acute Care Choice:    Choice offered to:     DME Arranged:    DME Agency:     HH Arranged:    HH Agency:     Status of Service:     Medicare Important Message Given:   yes (son Fayrene Fearingjames) Date Medicare IM Given:   03/22/15 Medicare IM give by:   Ermalene Searingnann Darreld Hoffer Date Additional Medicare IM Given:    Additional Medicare Important Message give by:     If discussed at Long Length of Stay Meetings, dates discussed:    Additional Comments:  Eber HongGreene, Deloy Archey R, RN 03/22/2015, 5:22 PM

## 2015-03-22 NOTE — Progress Notes (Signed)
Initial Nutrition Assessment  DOCUMENTATION CODES:   INTERVENTION: Medical Nutrition Supplement: Ensure Enlive (each supplement provides 350kcal and 20 grams of protein) TID with meals for additional nutrition. Meals and Snacks: Cater to patient preferences  NUTRITION DIAGNOSIS:  Inadequate oral intake related to acute illness as evidenced by meal completion < 50%, per patient/family report, energy intake < 75% for > 7 days, per family- 2 weeks.   GOAL: Energy intake: Patient will meet greater than or equal to 90% of their needs with meals and supplements  MONITOR:  PO intake, Supplement acceptance, Skin, I & O's, Labs, Weight trends  REASON FOR ASSESSMENT:  Other (Comment) (RD Screen- LOS)    ASSESSMENT: Reason For Admission: chest pain PMHx:  Past Medical History  Diagnosis Date  . Hypertension   . Diabetes mellitus without complication   . Dementia   . Arthritis     Typical Fluid/ Food Intake: no intake recorded; patient diet has been changed multiple times during admission. Meal/ Snack Patterns: Son at bedside reports that patient has not been eating much at all x 2 weeks. Reports that prior to that, the patient was eating well of a regular diet with no restrictions.   Supplements: none  Labs:  Electrolyte and Renal Profile:    Recent Labs Lab 03/16/15 1108  03/17/15 0719 03/18/15 0451 03/20/15 0350 03/22/15 1020  BUN 21*  --  17 14 16   --   CREATININE 1.09*  < > 1.01* 1.05* 0.97  --   NA 138  --  141 133* 137  --   K 3.7  --  3.5 3.6 4.2  --   MG 1.7  --   --   --  1.9 1.9  < > = values in this interval not displayed. Protein Profile:  Recent Labs Lab 03/16/15 1108  ALBUMIN 3.9   Glucose Profile:  Recent Labs  03/21/15 2050 03/22/15 0734 03/22/15 1102  GLUCAP 83 117* 160*    Meds: zocor, NS at 75/hr  UOP:   Intake/Output Summary (Last 24 hours) at 03/22/15 1537 Last data filed at 03/22/15 1357  Gross per 24 hour  Intake      0 ml   Output    550 ml  Net   -550 ml    Physical Findings: n/a  Weight Changes: Pt unable to recall recent weight history. Admission weight was 128#. Current weight represents a weight loss of 5# since admission- likely due to poor PO intake and multiple changes in diet from NPO to PO.   Height:  Ht Readings from Last 1 Encounters:  03/16/15 5\' 2"  (1.575 m)    Weight:  Wt Readings from Last 1 Encounters:  03/22/15 123 lb 14.4 oz (56.201 kg)    Ideal Body Weight:     Wt Readings from Last 10 Encounters:  03/22/15 123 lb 14.4 oz (56.201 kg)    BMI:  Body mass index is 22.66 kg/(m^2).  Estimated Nutritional Needs:  Kcal:  1172-1407 kcal/ day (BEE: 977 x 1.2 AF x 1.0-1.2 IF)  Protein:  67-84 g Pro/ day (1.2-1.5 g Pro/kg/day)  Fluid:  [id number] ml/ day (25-30 ml/ kg)  Skin:  Reviewed, no issues  Diet Order:  Diet Heart Room service appropriate?: Yes; Fluid consistency:: Thin  EDUCATION NEEDS:  Education needs no appropriate at this time   Intake/Output Summary (Last 24 hours) at 03/22/15 1532 Last data filed at 03/22/15 1357  Gross per 24 hour  Intake      0  ml  Output    550 ml  Net   -550 ml    Last BM:  5/12  Joeseph Amorracey L. Gaines, RDN Pager: [phone] Office: [phone]  MODERATE Care Level

## 2015-03-22 NOTE — Progress Notes (Signed)
[facility] [place] at [facility]   PATIENT NAME: Madison EdwardsMarie Montoya    MR#:  [id number]  DATE OF BIRTH:  [birth year]  SUBJECTIVE:  CHIEF COMPLAINT:   Chief Complaint  Patient presents with  . Chest Pain  . Shortness of Breath  . Altered Mental Status   Hg. Stable.  Had one episode of bloody stool this a.m. PO intake poor.  Family at bedside.  No other complaints.    REVIEW OF SYSTEMS:    Review of Systems  Constitutional: Negative for fever and chills.  HENT: Negative for congestion and tinnitus.   Eyes: Negative for blurred vision and double vision.  Respiratory: Negative for cough, shortness of breath and wheezing.   Cardiovascular: Negative for chest pain, orthopnea and PND.  Gastrointestinal: Positive for blood in stool (had 2 episodes loose stool in a.m. tomorrow and one in the afternoon. ). Negative for nausea, vomiting, abdominal pain and diarrhea.  Genitourinary: Negative for dysuria and hematuria.  Neurological: Positive for weakness (generalized). Negative for dizziness, sensory change and focal weakness.  All other systems reviewed and are negative.   Nutrition: Tolerating Diet:  Yes but very little.   Tolerating PT: Yes    DRUG ALLERGIES:  No Known Allergies  VITALS:  Blood pressure 160/70, pulse 74, temperature 98.3 F (36.8 C), temperature source Oral, resp. rate 18, height 5\' 2"  (1.575 m), weight 56.201 kg (123 lb 14.4 oz), SpO2 100 %.  PHYSICAL EXAMINATION:   Physical Exam  GENERAL:  81 y.o.-year-old pale appearing female lying in the bed with no acute distress.  EYES: Pupils equal, round, reactive to light and accommodation. No scleral icterus. Extraocular muscles intact. Pale conjunctiva.  HEENT: Head atraumatic, normocephalic. Oropharynx and nasopharynx clear. Dry oral mucosa  NECK:  Supple, no jugular venous distention. No thyroid enlargement, no tenderness.  LUNGS: Normal breath sounds bilaterally, no wheezing, rales,  rhonchi.  No use of accessory muscles of respiration.  CARDIOVASCULAR: Irregular rate, No Gallops, rubs, clicks.   ABDOMEN: Soft, nontender, nondistended. + BS. No organomegaly or mass.  EXTREMITIES: No cyanosis, clubbing or peripheral edema b/l.    NEUROLOGIC: Cranial nerves II through XII are intact. No focal Motor or sensory deficits b/l.  Globally weak PSYCHIATRIC: The patient is alert and oriented x 1.  Flat affect.  SKIN: No obvious rash, lesion, or ulcer.    LABORATORY PANEL:   CBC  Recent Labs Lab 03/22/15 0356  WBC 11.7*  HGB 7.9*  HCT 24.5*  PLT 338   ------------------------------------------------------------------------------------------------------------------  Chemistries   Recent Labs Lab 03/16/15 1108  03/20/15 0350  NA 138  < > 137  K 3.7  < > 4.2  CL 106  < > 108  CO2 21*  < > 24  GLUCOSE 150*  < > 128*  BUN 21*  < > 16  CREATININE 1.09*  < > 0.97  CALCIUM 10.1  < > 8.5*  MG 1.7  --  1.9  AST 31  --   --   ALT 15  --   --   ALKPHOS 42  --   --   BILITOT 0.7  --   --   < > = values in this interval not displayed. ------------------------------------------------------------------------------------------------------------------  Cardiac Enzymes  Recent Labs Lab 03/16/15 1550  TROPONINI 0.04*   ------------------------------------------------------------------------------------------------------------------  RADIOLOGY:  No results found.   ASSESSMENT AND PLAN:   81 yo female w/ PMH of Dementia, HTN, Type II DM, hx of left hip fracture  in 2013, who presented to the hospital due to a. Fib w/ RVR.   * A. Fib w/ RVR - pt. Presented to the hospital w/ HR in 160's.  - HR much improved and off cardizem gtt now.  - as per Cards. Cont. metoprolol, Cardizem, Dig and rates are stable.   - was on coumadin but on hold due to GI bleed/Anemia  - Echo done showing normal EF of 55 to 60%.   * Elevated Troponin - likely in the setting of demand  ischemia from A. Fib w/ RVR - cont. B-blocker, Statin.  Echo showing normal EF w/ no wall motion abnormalities.   - Cards following   * UTI - cont. levaquin X 5 days.     * Dementia w/ behavioral disturbance - cont. PRN haldol.  - avoid Benzo's.   * Bloody stools/GI bleed - had 3 episodes of Bloody stool yesterday.   - ordered Bleeding scan again but it could not be done as she has stopped bleeding again.  - Hg. Stable likely diverticular bleeding - Cont. To hold coumadin.  Hg. 7.9 this and will transfuse if < 7.  - as per GI no plans on urgent colonoscopy or endoscopy and they have signed off.   * Anemia - acute blood loss anemia due to GI bleed - Hg. Stable and will cont. To monitor.   - cont to hold coumadin.   * HTN - cont. Enalapril, Metoprolol.   * DM - SSI.   Seen by PT and they recommend SNF and family has agreed to discharge to SNF.   Will get Palliative care consult to discuss goals of care w/ family.   All the records are reviewed and case discussed with Care Management/Social Workerr. Management plans discussed with the patient, family and they are in agreement.  CODE STATUS: Full  DVT Prophylaxis: TED/SCD's.    TOTAL TIME TAKING CARE OF THIS PATIENT: 35 minutes.    Houston SirenSAINANI,Kaesha Kirsch J M.D on 03/22/2015 at 10:50 AM  Between 7am to 6pm - Pager - [phone]  After 6pm go to [email or web address] - password [email or web address] [email or web address]  [facility]  Office  [phone]  CC: Primary care physician; [facility]

## 2015-03-22 NOTE — Progress Notes (Signed)
Pt for [facility] medical clearance.[facility] will need d/c summary with anticipated d/c meds today in order for admission over weekend. MD aware.  Dellie BurnsJosie Bladimir Auman, MSW, [place] [phone]

## 2015-03-22 NOTE — Consult Note (Signed)
Palliative Medicine Inpatient Consult Note   Name: Madison EdwardsMarie Montoya Date: 03/22/2015 MRN: [id number]  DOB: 22-Oct-1932  Referring Physician: Houston SirenVivek J Sainani, MD  Palliative Care consult requested for this 81 y.o. female for goals of medical therapy in patient with dementia, OP with h/o hip fx, a.fib, DM admitted with rapid a.fib  Madison Montoya an 81 yo woman with PMH of dementia, a.fib on anticoagulation, HTN, hyperlipidemia, DM, CAD, OP with h/o L.hip fx (09/2012), depression. She was admitted 03/16/15 with altered mental status. She was admitted with UTI and a.fib with RVR. Hospital course complicated by acute blood loss anemia, with bloody stools. At present, pt Montoya lying in bed. Confused. Family not present.    REVIEW OF SYSTEMS:  Patient Montoya not able to provide ROS    SOCIAL HISTORY:Pt lives with her grandson. She has a daughter and a son.  reports that she has never smoked. She does not have any smokeless tobacco history on file. She reports that she does not drink alcohol.   CODE STATUS: Full code  PAST MEDICAL HISTORY: Past Medical History  Diagnosis Date  . Hypertension   . Diabetes mellitus without complication   . Dementia   . Arthritis     PAST SURGICAL HISTORY:  Past Surgical History  Procedure Laterality Date  . Fracture surgery      ALLERGIES:  has No Known Allergies.  MEDICATIONS:  Current Facility-Administered Medications  Medication Dose Route Frequency Provider Last Rate Last Dose  . 0.9 %  sodium chloride infusion  250 mL Intravenous PRN Shaune PollackQing Chen, MD      . 0.9 %  sodium chloride infusion   Intravenous Continuous Houston SirenVivek J Sainani, MD 75 mL/hr at 03/22/15 1202    . acetaminophen (TYLENOL) tablet 650 mg  650 mg Oral Q6H PRN Shaune PollackQing Chen, MD   650 mg at 03/22/15 0945   Or  . acetaminophen (TYLENOL) suppository 650 mg  650 mg Rectal Q6H PRN Shaune PollackQing Chen, MD      . albuterol (PROVENTIL) (2.5 MG/3ML) 0.083% nebulizer solution 2.5 mg  2.5 mg Nebulization Q2H PRN  Shaune PollackQing Chen, MD      . antiseptic oral rinse (CPC / CETYLPYRIDINIUM CHLORIDE 0.05%) solution 7 mL  7 mL Mouth Rinse q12n4p Houston SirenVivek J Sainani, MD   7 mL at 03/22/15 1229  . calcium-vitamin D (OSCAL WITH D) 500-200 MG-UNIT per tablet 1 tablet  1 tablet Oral Lesle ReekBH-q7a Shaune PollackQing Chen, MD   1 tablet at 03/22/15 0945  . chlorhexidine (PERIDEX) 0.12 % solution 15 mL  15 mL Mouth Rinse BID Houston SirenVivek J Sainani, MD   15 mL at 03/22/15 0857  . digoxin (LANOXIN) tablet 0.125 mg  0.125 mg Oral Daily Ryan M Dunn, PA-C   0.125 mg at 03/22/15 0945  . diltiazem (CARDIZEM) tablet 30 mg  30 mg Oral 4 times per day Antonieta Ibaimothy J Gollan, MD   30 mg at 03/22/15 1223  . enalapril (VASOTEC) tablet 5 mg  5 mg Oral BID Shaune PollackQing Chen, MD   5 mg at 03/22/15 0944  . famotidine (PEPCID) tablet 40 mg  40 mg Oral QHS Houston SirenVivek J Sainani, MD   40 mg at 03/20/15 2225  . haloperidol lactate (HALDOL) injection 2.5 mg  2.5 mg Intravenous Q4H PRN Houston SirenVivek J Sainani, MD   2.5 mg at 03/21/15 0009  . insulin aspart (novoLOG) injection 0-9 Units  0-9 Units Subcutaneous TID WC Shaune PollackQing Chen, MD   2 Units at 03/22/15 1223  . levofloxacin (LEVAQUIN)  tablet 250 mg  250 mg Oral Daily Houston SirenVivek J Sainani, MD   250 mg at 03/22/15 0945  . metoprolol (LOPRESSOR) injection 5 mg  5 mg Intravenous Q5 min PRN Sondra Bargesyan M Dunn, PA-C   5 mg at 03/20/15 1222  . metoprolol (LOPRESSOR) tablet 50 mg  50 mg Oral BID Raymon Muttonyan M Dunn, PA-C   50 mg at 03/22/15 0945  . ondansetron (ZOFRAN) injection 4 mg  4 mg Intravenous Q6H PRN Houston SirenVivek J Sainani, MD   4 mg at 03/18/15 1842  . simvastatin (ZOCOR) tablet 20 mg  20 mg Oral QPM Shaune PollackQing Chen, MD   20 mg at 03/20/15 1703  . sodium chloride 0.9 % injection 3 mL  3 mL Intravenous Q12H Shaune PollackQing Chen, MD   3 mL at 03/22/15 0948  . sodium chloride 0.9 % injection 3 mL  3 mL Intravenous PRN Shaune PollackQing Chen, MD        Vital Signs: BP 127/55 mmHg  Pulse 96  Temp(Src) 98.5 F (36.9 C) (Oral)  Resp 18  Ht 5\' 2"  (1.575 m)  Wt 56.201 kg (123 lb 14.4 oz)  BMI 22.66 kg/m2  SpO2  100% Filed Weights   03/20/15 2105 03/21/15 0424 03/22/15 0559  Weight: 60.691 kg (133 lb 12.8 oz) 57.199 kg (126 lb 1.6 oz) 56.201 kg (123 lb 14.4 oz)    Estimated body mass index Montoya 22.66 kg/(m^2) as calculated from the following:   Height as of this encounter: 5\' 2"  (1.575 m).   Weight as of this encounter: 56.201 kg (123 lb 14.4 oz).   PHYSICAL EXAM:  Generall:ill-appearing HEENT: OP clear, moist oral mucosa Neck: Trachea midline  Cardiovascular: irregular rate and rhythm Pulmonary/Chest: coarse BS's ant fields Abdominal: Soft, NTTP, hypoactive bowel sounds GU: No SP tenderness Extremities: No edema  Neurological: moves extremities, does not follow commands Skin: no rashes  Psychiatric: disoriented, cannot tell me her name  LABS: CBC:  Recent Labs Lab 03/21/15 0523 03/21/15 1753 03/22/15 0356  WBC 17.1*  --  11.7*  HGB 9.1* 8.0* 7.9*  HCT 27.1*  --  24.5*  PLT 347  --  338   Comprehensive Metabolic Panel:  Recent Labs Lab 03/16/15 1108  03/18/15 0451 03/20/15 0350 03/22/15 1020  NA 138  < > 133* 137  --   K 3.7  < > 3.6 4.2  --   CL 106  < > 101 108  --   CO2 21*  < > 24 24  --   GLUCOSE 150*  < > 147* 128*  --   BUN 21*  < > 14 16  --   CREATININE 1.09*  < > 1.05* 0.97  --   CALCIUM 10.1  < > 9.0 8.5*  --   MG 1.7  --   --  1.9 1.9  AST 31  --   --   --   --   ALT 15  --   --   --   --   ALKPHOS 42  --   --   --   --   BILITOT 0.7  --   --   --   --   < > = values in this interval not displayed.  IMPRESSION:  Madison Montoya an 81 yo woman with PMH of dementia, a.fib on anticoagulation, HTN, hyperlipidemia, DM, CAD, OP with h/o L.hip fx (09/2012), depression. She was admitted 03/16/15 with altered mental status. She was admitted with UTI and a.fib with RVR. Hospital  course complicated by acute blood loss anemia, with bloody stools.   Pt unable to give me any history and family not present. Will meet with family when available.    PLAN: As  above  More than 50% of the visit was spent in counseling/coordination of care: YES  Time spent: 50 minutes

## 2015-03-22 NOTE — Progress Notes (Signed)
Patient: Madison EdwardsMarie Montoya / Admit Date: 03/16/2015 / Date of Encounter: 03/22/2015, 9:04 AM   Subjective: Heart rates in the 130s this morning with a-fib with RVR. She was made NPO without meds overnight. She had a large bloody bowel movement on the evening of 5/12. Hgb 7.9 this morning. Bmet and magnesium pending.   Review of Systems: Review of Systems  Constitutional: Positive for weight loss and malaise/fatigue. Negative for fever, chills and diaphoresis.  Respiratory: Positive for shortness of breath. Negative for cough and wheezing.   Cardiovascular: Negative for chest pain and palpitations.  Gastrointestinal: Positive for blood in stool.  Neurological: Positive for weakness.    Objective: Telemetry: a-fib with RVR, 130s Physical Exam: Blood pressure 160/70, pulse 74, temperature 98.3 F (36.8 C), temperature source Oral, resp. rate 18, height 5\' 2"  (1.575 m), weight 123 lb 14.4 oz (56.201 kg), SpO2 100 %. Body mass index is 22.66 kg/(m^2). General: Frail appearing, in no acute distress. Head: Normocephalic, atraumatic, sclera non-icteric, no xanthomas, nares are without discharge. Neck: Negative for carotid bruits. JVP not elevated. Lungs: Clear bilaterally to auscultation without wheezes, rales, or rhonchi. Breathing is unlabored. Heart: Irregularly, irregular, tachycardic, S1 S2 without murmurs, rubs, or gallops.  Abdomen: Soft, non-tender, non-distended with normoactive bowel sounds. No rebound/guarding. Extremities: No clubbing or cyanosis. No edema. Distal pedal pulses are 2+ and equal bilaterally. Neuro: Alert. Moves all extremities spontaneously. Psych:  Responds to questions appropriately with a normal affect.   Intake/Output Summary (Last 24 hours) at 03/22/15 0904 Last data filed at 03/22/15 0601  Gross per 24 hour  Intake      0 ml  Output    450 ml  Net   -450 ml    Inpatient Medications:  . antiseptic oral rinse  7 mL Mouth Rinse q12n4p  . calcium-vitamin  D  1 tablet Oral BH-q7a  . chlorhexidine  15 mL Mouth Rinse BID  . digoxin  0.25 mg Oral Daily  . diltiazem  30 mg Oral 4 times per day  . enalapril  5 mg Oral BID  . famotidine  40 mg Oral QHS  . insulin aspart  0-9 Units Subcutaneous TID WC  . levofloxacin  250 mg Oral Daily  . metoprolol  25 mg Oral 3 times per day  . simvastatin  20 mg Oral QPM  . sodium chloride  3 mL Intravenous Q12H   Infusions:    Labs:  Recent Labs  03/20/15 0350  NA 137  K 4.2  CL 108  CO2 24  GLUCOSE 128*  BUN 16  CREATININE 0.97  CALCIUM 8.5*  MG 1.9   No results for input(s): AST, ALT, ALKPHOS, BILITOT, PROT, ALBUMIN in the last 72 hours.  Recent Labs  03/21/15 0523 03/21/15 1753 03/22/15 0356  WBC 17.1*  --  11.7*  HGB 9.1* 8.0* 7.9*  HCT 27.1*  --  24.5*  MCV 86.5  --  87.6  PLT 347  --  338   No results for input(s): CKTOTAL, CKMB, TROPONINI in the last 72 hours. Invalid input(s): POCBNP No results for input(s): HGBA1C in the last 72 hours.   Weights: Filed Weights   03/20/15 2105 03/21/15 0424 03/22/15 0559  Weight: 133 lb 12.8 oz (60.691 kg) 126 lb 1.6 oz (57.199 kg) 123 lb 14.4 oz (56.201 kg)     Radiology/Studies:  Ct Abdomen Pelvis W Contrast  03/16/2015   CLINICAL DATA:  Foul-smelling urine, nausea and vomiting  EXAM: CT ABDOMEN AND PELVIS  WITH CONTRAST  TECHNIQUE: Multidetector CT imaging of the abdomen and pelvis was performed using the standard protocol following bolus administration of intravenous contrast.  CONTRAST:  80mL OMNIPAQUE IOHEXOL 300 MG/ML  SOLN  COMPARISON:  None.  FINDINGS: Lung bases demonstrate some mild emphysematous changes.  The liver, gallbladder, spleen, adrenal glands and pancreas are within normal limits. Small sliding-type hiatal hernia is noted.  The kidneys are well visualized bilaterally. Cortical thinning is noted. The left kidney is somewhat smaller than the right. No obstructive changes or renal calculi are noted.  Diffuse aortoiliac  calcifications are seen. The appendix is within normal limits. The bladder is well distended. No pelvic mass lesion is seen. Diverticular change of the colon is noted without diverticulitis. Prominent right ovary is noted with some central calcifications. Calcification was noted on a prior exam from 2013. Postsurgical changes are noted in the proximal left femur consistent with prior fracture and fixation. Degenerative changes of the lumbar spine are seen. A mild scoliosis of the lumbar spine is noted. No compression deformities are seen.  IMPRESSION: Diverticulosis without diverticulitis.  Changes in the right adnexal region which may represent an old dermoid. The calcification is stable from 2013  No acute abnormality is noted.   Electronically Signed   By: Alcide CleverMark  Lukens M.D.   On: 03/16/2015 15:44   Dg Chest Portable 1 View  03/16/2015   CLINICAL DATA:  Chest pain and shortness of breath. Altered mental status.  EXAM: PORTABLE CHEST - 1 VIEW  COMPARISON:  06/24/2013  FINDINGS: Cardiac silhouette is mildly enlarged. No mediastinal or hilar masses or convincing adenopathy. There is mild interstitial thickening is similar to the prior exam no lung consolidation or convincing pulmonary edema. No pleural effusion or pneumothorax.  Bony thorax is demineralized but grossly intact.  IMPRESSION: No acute cardiopulmonary disease.   Electronically Signed   By: Amie Portlandavid  Ormond M.D.   On: 03/16/2015 11:51     Assessment and Plan  81 y.o. female with history of dementia and chronic atrial fibrillation presented on 5/8 with atrial fibrillation/RVR and UTI.   1. Chronic atrial fibrillation with RVR:  -Suspect that the rapid rate at admission was driven by the UTI.  -Suspect the persisting atrial fibrillation with RVR is likely 2/2 stress from UTI, GI bleed/anemia.  -Heart rate back into the 130s this morning after medications held overnight in the setting large bloody bowel movement on 5/12. -Decrease po digoxin to  0.12g mg daily. -Change metoprolol to 50 mg bid, with plan to titrate as needed pending heart rate. -Coumadin on hold 2/2 multiple bloody bowel movements this admission and anemia. Risks of stroke discussed with patient's son.  -ASA held. -Limited echo showed normal EF, had to be stopped 2/2 chest pain. Can repeat as outpatient if needed.  2. LGIB: -3 bloody stools 5/9 -1 large bloody stool 5/12, planning for bleeding study today -Seen by Dr. Markham JordanElliot, no colo planned  -Warfarin on hold, s/p pRBC transfusion on 5/10  3. UTI:  -Treating per primary service with levofloxacin.   3. Elevated troponin:  -Very mild with flat plateau. This was in the setting of atrial fibrillation with RVR and likely represents demand ischemia. -Would hold off on ischemic evaluation unless clinical picture changes.   Signed, Eula Listenyan Dunn, PA-C 03/22/2015 9:25 AM

## 2015-03-22 NOTE — Progress Notes (Signed)
Per MD Sainani ordered a hemoglobin check for the AM

## 2015-03-23 LAB — GLUCOSE, CAPILLARY
GLUCOSE-CAPILLARY: 153 mg/dL — AB (ref 65–99)
GLUCOSE-CAPILLARY: 172 mg/dL — AB (ref 65–99)
Glucose-Capillary: 140 mg/dL — ABNORMAL HIGH (ref 65–99)
Glucose-Capillary: 85 mg/dL (ref 65–99)

## 2015-03-23 LAB — HEMOGLOBIN: Hemoglobin: 7.9 g/dL — ABNORMAL LOW (ref 12.0–16.0)

## 2015-03-23 MED ORDER — DILTIAZEM HCL ER COATED BEADS 120 MG PO CP24
120.0000 mg | ORAL_CAPSULE | Freq: Every day | ORAL | Status: DC
  Administered 2015-03-23 – 2015-03-24 (×2): 120 mg via ORAL
  Filled 2015-03-23 (×2): qty 1

## 2015-03-23 NOTE — Progress Notes (Signed)
Patient Name: Madison EdwardsMarie Montoya      SUBJECTIVE: 81 year old lady with a history of dementia progressive confusion and permanent atrial fibrillation admitted about a week ago with foul-smelling urine attributed to her UTI. For atrial fibrillation with rapid, calcium blockers were initiated. He became bradycardic and hypotensive.  Hospital course has also been complicated by acute GI bleeding with CT scanning demonstrating diverticulosis. Given her comorbidities, the decision has been made to treat her conservatively. Hemoglobin was 9.1--7.9 2 days ago->>yesterday; it is stable overnight.  Palliative care has been involved but nothing has been decided  She has no complaints except that she wants her telemetry electrodes off  Echocardiogram 03/17/15 demonstrated normal LV function and biatrial enlargement  Past Medical History  Diagnosis Date  . Hypertension   . Diabetes mellitus without complication   . Dementia   . Arthritis   . History of echocardiogram     a. echo 03/2015: EF 55-60%, LA/RA mildly dilated, no AI    Scheduled Meds:  Scheduled Meds: . antiseptic oral rinse  7 mL Mouth Rinse q12n4p  . calcium-vitamin D  1 tablet Oral BH-q7a  . chlorhexidine  15 mL Mouth Rinse BID  . digoxin  0.125 mg Oral Daily  . diltiazem  30 mg Oral 4 times per day  . enalapril  5 mg Oral BID  . famotidine  40 mg Oral QHS  . feeding supplement (ENSURE ENLIVE)  237 mL Oral TID WC  . insulin aspart  0-9 Units Subcutaneous TID WC  . levofloxacin  250 mg Oral Daily  . metoprolol  50 mg Oral BID  . simvastatin  20 mg Oral QPM  . sodium chloride  3 mL Intravenous Q12H   Continuous Infusions: . sodium chloride 75 mL/hr at 03/23/15 1418   sodium chloride, acetaminophen **OR** acetaminophen, albuterol, haloperidol lactate, metoprolol, ondansetron (ZOFRAN) IV, sodium chloride    PHYSICAL EXAM Filed Vitals:   03/23/15 0427 03/23/15 0541 03/23/15 0700 03/23/15 1140  BP:  148/79 127/65  120/61  Pulse:  90 73 102  Temp:  97.9 F (36.6 C) 97.8 F (36.6 C) 97.4 F (36.3 C)  TempSrc:  Oral Oral Oral  Resp:  16  18  Height:      Weight: 127 lb 4.8 oz (57.743 kg)     SpO2:  100% 93% 98%   Well developed and nourished in no acute distress HENT normal Neck supple with JVP-flat Clear Irregularly irregular rate and rhythm Abd-soft with active BS No Clubbing cyanosis edema Skin-warm and dry A  but not oriented. Grossly normal sensory and motor function   TELEMETRY: Reviewed telemetry pt in  afib : Heart rates 110--40. There are swings every 4 hours or 6 hours or so with medications    Intake/Output Summary (Last 24 hours) at 03/23/15 1518 Last data filed at 03/23/15 1352  Gross per 24 hour  Intake   1050 ml  Output   2050 ml  Net  -1000 ml    LABS: Basic Metabolic Panel:  Recent Labs Lab 03/16/15 2135  03/17/15 0719 03/18/15 0451 03/20/15 0350 03/22/15 1020  NA  --   --  141 133* 137 135  K  --   --  3.5 3.6 4.2 3.8  CL  --   --  108 101 108 103  CO2  --   --  24 24 24 24   GLUCOSE  --   --  127* 147* 128* 142*  BUN  --   --  17 14 16 16   CREATININE 0.95  --  1.01* 1.05* 0.97 0.90  CALCIUM  --   < > 9.3 9.0 8.5* 9.3  MG  --   --   --   --  1.9 1.9  < > = values in this interval not displayed. Cardiac Enzymes: No results for input(s): CKTOTAL, CKMB, CKMBINDEX, TROPONINI in the last 72 hours. CBC:  Recent Labs Lab 03/16/15 2135 03/17/15 0719 03/18/15 0451  03/18/15 1646 03/19/15 0142 03/20/15 0351 03/21/15 0523 03/21/15 1753 03/22/15 0356 03/23/15 0451  WBC 9.8 15.1* 17.1*  --   --   --  11.2* 17.1*  --  11.7*  --   HGB 9.6* 9.7* 9.7*  < > 8.5* 7.3* 8.5* 9.1* 8.0* 7.9* 7.9*  HCT 27.9* 29.6* 29.3*  --   --   --  26.1* 27.1*  --  24.5*  --   MCV 95.1 95.0 95.3  --   --   --  87.2 86.5  --  87.6  --   PLT 289 308 321  --   --   --  277 347  --  338  --   < > = values in this interval not displayed. :   ASSESSMENT AND PLAN:  Principal  Problem:   A-fib Active Problems:   UTI (lower urinary tract infection)   Leukocytosis   HTN (hypertension)   DM (diabetes mellitus)   Blood loss anemia   Acute lower GI bleeding   Persistent atrial fibrillation  Relatively controlled atrial fibrillation With normal LV function I would discontinue her digoxin. I will change her diltiazem to a daily acting preparation in the hopes of trying to even out her heart rate  Signed, Sherryl MangesSteven Klein MD  03/23/2015

## 2015-03-23 NOTE — Progress Notes (Signed)
[facility] [place] [facility]   PATIENT NAME: Madison EdwardsMarie Montoya    MR#:  [id number]  DATE OF BIRTH:  1932/03/28  SUBJECTIVE:  CHIEF COMPLAINT:   Chief Complaint  Patient presents with  . Chest Pain  . Shortness of Breath  . Altered Mental Status   Hg. Stable.  No bloody stools overnight.  Tolerating PO o.k. Son at bedside.    REVIEW OF SYSTEMS:    Review of Systems  Constitutional: Negative for fever and chills.  HENT: Negative for congestion and tinnitus.   Eyes: Negative for blurred vision and double vision.  Respiratory: Negative for cough, shortness of breath and wheezing.   Cardiovascular: Negative for chest pain, orthopnea and PND.  Gastrointestinal: Negative for nausea, vomiting, abdominal pain, diarrhea and blood in stool.  Genitourinary: Negative for dysuria and hematuria.  Neurological: Positive for weakness (generalized.). Negative for dizziness, sensory change and focal weakness.  Psychiatric/Behavioral: Positive for depression (Flat affect).  All other systems reviewed and are negative.   Nutrition: Regular diet Tolerating Diet:  Yes Tolerating PT: Yes    DRUG ALLERGIES:  No Known Allergies  VITALS:  Blood pressure 127/65, pulse 73, temperature 97.8 F (36.6 C), temperature source Oral, resp. rate 16, height 5\' 2"  (1.575 m), weight 57.743 kg (127 lb 4.8 oz), SpO2 93 %.  PHYSICAL EXAMINATION:   Physical Exam  GENERAL:  81 y.o.-year-old pale appearing female lying in the bed with no acute distress.  EYES: Pupils equal, round, reactive to light and accommodation. No scleral icterus. Extraocular muscles intact. Pale conjunctiva.  HEENT: Head atraumatic, normocephalic. Oropharynx and nasopharynx clear. Moist oral mucosa  NECK:  Supple, no jugular venous distention. No thyroid enlargement, no tenderness.  LUNGS: Normal breath sounds bilaterally, no wheezing, rales, rhonchi.  No use of accessory muscles of respiration.   CARDIOVASCULAR: Irregular rate, No Gallops, rubs, clicks.   ABDOMEN: Soft, nontender, nondistended. + BS. No organomegaly or mass.  EXTREMITIES: No cyanosis, clubbing or peripheral edema b/l.    NEUROLOGIC: Cranial nerves II through XII are intact. No focal Motor or sensory deficits b/l.  Globally weak PSYCHIATRIC: The patient is alert and oriented x 1.  Flat affect.  SKIN: No obvious rash, lesion, or ulcer.    LABORATORY PANEL:   CBC  Recent Labs Lab 03/22/15 0356 03/23/15 0451  WBC 11.7*  --   HGB 7.9* 7.9*  HCT 24.5*  --   PLT 338  --    ------------------------------------------------------------------------------------------------------------------  Chemistries   Recent Labs Lab 03/16/15 1108  03/22/15 1020  NA 138  < > 135  K 3.7  < > 3.8  CL 106  < > 103  CO2 21*  < > 24  GLUCOSE 150*  < > 142*  BUN 21*  < > 16  CREATININE 1.09*  < > 0.90  CALCIUM 10.1  < > 9.3  MG 1.7  < > 1.9  AST 31  --   --   ALT 15  --   --   ALKPHOS 42  --   --   BILITOT 0.7  --   --   < > = values in this interval not displayed. ------------------------------------------------------------------------------------------------------------------  Cardiac Enzymes  Recent Labs Lab 03/16/15 1550  TROPONINI 0.04*   ------------------------------------------------------------------------------------------------------------------  RADIOLOGY:  No results found.   ASSESSMENT AND PLAN:   81 yo female w/ PMH of Dementia, HTN, Type II DM, hx of left hip fracture in 2013, who presented to the hospital due  to a. Fib w/ RVR.   * A. Fib w/ RVR - pt. Presented to the hospital w/ HR in 160's.  - HR much improved and off cardizem gtt now.  - Cont. metoprolol, Cardizem, Dig and rates are stable.   - was on coumadin but now off indefinitely due to high fall risk, GI bleed.  - Echo done showing normal EF of 55 to 60%.   * Elevated Troponin - likely in the setting of demand ischemia from A.  Fib w/ RVR - cont. B-blocker, Statin.  Echo showing normal EF w/ no wall motion abnormalities.   - Cards following   * UTI - cont. levaquin X 2 more days.     * Dementia w/ behavioral disturbance - cont. PRN haldol.  - avoid Benzo's.   * Bloody stools/GI bleed - No bloody stools overnight.   - Hg. Stable likely diverticular bleeding - Cont. To hold coumadin.  Hg. 7.9 this a.m. and will transfuse if < 7.  - as per GI no plans on urgent colonoscopy or endoscopy and they have signed off.   * Anemia - acute blood loss anemia due to GI bleed - Hg. Stable and will cont. To monitor.   - cont to hold coumadin.   * HTN - cont. Enalapril, Metoprolol.   * DM - SSI.   Seen by PT and they recommend SNF and family has agreed to discharge to SNF. Possible d/c to SNF this weekend if bed available.   Seen by palliative care yesterday and remains a full code for now.    All the records are reviewed and case discussed with Care Management/Social Workerr. Management plans discussed with the patient, family and they are in agreement.  CODE STATUS: Full  DVT Prophylaxis: TED/SCD's.    TOTAL TIME TAKING CARE OF THIS PATIENT: 30 minutes.    Houston SirenSAINANI,Lorriane Dehart J M.D on 03/23/2015 at 8:30 AM  Between 7am to 6pm - Pager - [phone]  After 6pm go to [email or web address] - password [email or web address] [facility]  [facility]  Office  [phone]  CC: Primary care physician; [facility]IC

## 2015-03-24 LAB — GLUCOSE, CAPILLARY
Glucose-Capillary: 109 mg/dL — ABNORMAL HIGH (ref 65–99)
Glucose-Capillary: 197 mg/dL — ABNORMAL HIGH (ref 65–99)
Glucose-Capillary: 129 mg/dL — ABNORMAL HIGH (ref 65–99)
Glucose-Capillary: 141 mg/dL — ABNORMAL HIGH (ref 65–99)

## 2015-03-24 LAB — CBC
HCT: 27.2 % — ABNORMAL LOW (ref 35.0–47.0)
Hemoglobin: 8.6 g/dL — ABNORMAL LOW (ref 12.0–16.0)
MCH: 27.7 pg (ref 26.0–34.0)
MCHC: 31.6 g/dL — ABNORMAL LOW (ref 32.0–36.0)
MCV: 87.7 fL (ref 80.0–100.0)
PLATELETS: 458 10*3/uL — AB (ref 150–440)
RBC: 3.1 MIL/uL — ABNORMAL LOW (ref 3.80–5.20)
RDW: 20.3 % — AB (ref 11.5–14.5)
WBC: 19.4 10*3/uL — ABNORMAL HIGH (ref 3.6–11.0)

## 2015-03-24 NOTE — Progress Notes (Signed)
[facility] [place] [facility]   PATIENT NAME: Madison EdwardsMarie Montoya    MR#:  [id number]  DATE OF BIRTH:  1932-02-26  SUBJECTIVE:  CHIEF COMPLAINT:   Chief Complaint  Patient presents with  . Chest Pain  . Shortness of Breath  . Altered Mental Status   Hg. Stable.  No bloody stools overnight.  Tolerating PO o.k. Son at bedside.    REVIEW OF SYSTEMS:    Review of Systems  Constitutional: Negative for fever, chills and weight loss.  HENT: Negative for ear discharge, ear pain and nosebleeds.   Eyes: Negative for blurred vision, pain and discharge.  Respiratory: Negative for sputum production, shortness of breath, wheezing and stridor.   Cardiovascular: Negative for chest pain, palpitations, orthopnea and PND.  Gastrointestinal: Negative for nausea, vomiting, abdominal pain and diarrhea.  Genitourinary: Negative for urgency and frequency.  Musculoskeletal: Negative for back pain, joint pain and neck pain.  Neurological: Negative for sensory change, speech change, focal weakness and weakness.  Psychiatric/Behavioral: Negative for depression. The patient is not nervous/anxious.     Nutrition: Regular diet Tolerating Diet:  Yes Tolerating PT: Yes    DRUG ALLERGIES:  No Known Allergies  VITALS:  Blood pressure 119/62, pulse 112, temperature 97.5 F (36.4 C), temperature source Oral, resp. rate 18, height 5\' 2"  (1.575 m), weight 58.287 kg (128 lb 8 oz), SpO2 100 %.  PHYSICAL EXAMINATION:   Physical Exam  GENERAL:  81 y.o.-year-old pale appearing female lying in the bed with no acute distress.  EYES: Pupils equal, round, reactive to light and accommodation. No scleral icterus. Extraocular muscles intact. Pale conjunctiva.  HEENT: Head atraumatic, normocephalic. Oropharynx and nasopharynx clear. Moist oral mucosa  NECK:  Supple, no jugular venous distention. No thyroid enlargement, no tenderness.  LUNGS: Normal breath sounds bilaterally, no wheezing,  rales, rhonchi.  No use of accessory muscles of respiration.  CARDIOVASCULAR: Irregular rate, No Gallops, rubs, clicks.   ABDOMEN: Soft, nontender, nondistended. + BS. No organomegaly or mass.  EXTREMITIES: No cyanosis, clubbing or peripheral edema b/l.    NEUROLOGIC: Cranial nerves II through XII are intact. No focal Motor or sensory deficits b/l.  Globally weak PSYCHIATRIC: The patient is alert and oriented x 1.  Flat affect.  SKIN: No obvious rash, lesion, or ulcer.    LABORATORY PANEL:   CBC  Recent Labs Lab 03/22/15 0356 03/23/15 0451  WBC 11.7*  --   HGB 7.9* 7.9*  HCT 24.5*  --   PLT 338  --    ------------------------------------------------------------------------------------------------------------------  Chemistries   Recent Labs Lab 03/22/15 1020  NA 135  K 3.8  CL 103  CO2 24  GLUCOSE 142*  BUN 16  CREATININE 0.90  CALCIUM 9.3  MG 1.9   ------------------------------------------------------------------------------------------------------------------  Cardiac Enzymes No results for input(s): TROPONINI in the last 168 hours. ------------------------------------------------------------------------------------------------------------------  RADIOLOGY:  No results found.   ASSESSMENT AND PLAN:   81 yo female w/ PMH of Dementia, HTN, Type II DM, hx of left hip fracture in 2013, who presented to the hospital due to a. Fib w/ RVR.   * A. Fib w/ RVR - pt. Presented to the hospital w/ HR in 160's.  - HR much improved and off cardizem gtt now.  - Cont. metoprolol, Cardizem, Dig and rates are stable.   - was on coumadin but now off indefinitely due to high fall risk, GI bleed.  - Echo done showing normal EF of 55 to 60%.   *  Elevated Troponin - likely in the setting of demand ischemia from A. Fib w/ RVR - cont. B-blocker, Statin.  Echo showing normal EF w/ no wall motion abnormalities.   - Cards following   * UTI - cont. levaquin X 1 more day.     *  Dementia w/ behavioral disturbance - cont. PRN haldol.  - avoid Benzo's.   * Bloody stools/GI bleed - No bloody stools overnight.   - Hg. Stable likely diverticular bleeding - Cont. To hold coumadin.  Hg. 7.9 this a.m. and will transfuse if < 7.  - as per GI no plans on urgent colonoscopy or endoscopy and they have signed off.   * Anemia - acute blood loss anemia due to GI bleed - Hg. Stable and will cont. To monitor.   - cont to hold coumadin.   * HTN - cont. Enalapril, Metoprolol.   * DM - SSI.   Seen by PT and they recommend SNF and family has agreed to discharge to SNF. Possible d/c to SNF on monday Seen by palliative care yesterday and remains a full code for now.    All the records are reviewed and case discussed with Care Management/Social Workerr. Management plans discussed with the patient, family and they are in agreement.  CODE STATUS: Full  DVT Prophylaxis: TED/SCD's.    TOTAL TIME TAKING CARE OF THIS PATIENT: 30 minutes.    Hermie Reagor M.D on 03/24/2015 at 2:03 PM  Between 7am to 6pm - Pager - [phone]  After 6pm go to [email or web address] - password [email or web address] [facility]  [facility]  Office  [phone]  CC: Primary care physician; [facility]AL MEDICAL CLINIC

## 2015-03-24 NOTE — Progress Notes (Signed)
Patient Name: Madison EdwardsMarie Montoya      SUBJECTIVE: 81 year old lady with a history of dementia progressive confusion and permanent atrial fibrillation admitted about a week ago with foul-smelling urine attributed to her UTI. For atrial fibrillation with rapid, calcium blockers were initiated. She became bradycardic and hypotensive.  Hospital course has also been complicated by acute GI bleeding with CT scanning demonstrating diverticulosis. Given her comorbidities, the decision has been made to treat her conservatively. Hemoglobin was 9.1--7.9 2 days ago->>yesterday; it was not measured this morning.  Palliative care has been involved but nothing has been decided  She has no complaints except that she wants her telemetry electrodes off  Echocardiogram 03/17/15 demonstrated normal LV function and biatrial enlargement  Past Medical History  Diagnosis Date  . Hypertension   . Diabetes mellitus without complication   . Dementia   . Arthritis   . History of echocardiogram     a. echo 03/2015: EF 55-60%, LA/RA mildly dilated, no AI    Scheduled Meds:  Scheduled Meds: . antiseptic oral rinse  7 mL Mouth Rinse q12n4p  . calcium-vitamin D  1 tablet Oral [id number]  . chlorhexidine  15 mL Mouth Rinse BID  . diltiazem  120 mg Oral QHS  . enalapril  5 mg Oral BID  . famotidine  40 mg Oral QHS  . feeding supplement (ENSURE ENLIVE)  237 mL Oral TID WC  . insulin aspart  0-9 Units Subcutaneous TID WC  . levofloxacin  250 mg Oral Daily  . metoprolol  50 mg Oral BID  . simvastatin  20 mg Oral QPM   Continuous Infusions:   sodium chloride, acetaminophen **OR** acetaminophen, albuterol, haloperidol lactate, metoprolol, ondansetron (ZOFRAN) IV    PHYSICAL EXAM Filed Vitals:   03/23/15 2042 03/24/15 0526 03/24/15 0553 03/24/15 1123  BP: 108/52 157/62  119/62  Pulse: 106 101  112  Temp: 97.9 F (36.6 C) 96.8 F (36 C) 97.7 F (36.5 C) 97.5 F (36.4 C)  TempSrc:  Axillary Axillary  Oral  Resp: 20 18  18   Height:      Weight:  128 lb 8 oz (58.287 kg)    SpO2: 100% 95%  100%   Well developed and nourished in no acute distress and asleep HENT normal Irregularly irregular rate and rhythm No Clubbing cyanosis edema Skin-warm and dry      TELEMETRY: Reviewed telemetry pt in  afib : Heart rates   70-110 .     Intake/Output Summary (Last 24 hours) at 03/24/15 1246 Last data filed at 03/24/15 1148  Gross per 24 hour  Intake    600 ml  Output   3280 ml  Net  -2680 ml    LABS: Basic Metabolic Panel:  Recent Labs Lab 03/18/15 0451 03/20/15 0350 03/22/15 1020  NA 133* 137 135  K 3.6 4.2 3.8  CL 101 108 103  CO2 24 24 24   GLUCOSE 147* 128* 142*  BUN 14 16 16   CREATININE 1.05* 0.97 0.90  CALCIUM 9.0 8.5* 9.3  MG  --  1.9 1.9   Cardiac Enzymes: No results for input(s): CKTOTAL, CKMB, CKMBINDEX, TROPONINI in the last 72 hours. CBC:  Recent Labs Lab 03/18/15 0451  03/18/15 1646 03/19/15 0142 03/20/15 0351 03/21/15 0523 03/21/15 1753 03/22/15 0356 03/23/15 0451  WBC 17.1*  --   --   --  11.2* 17.1*  --  11.7*  --   HGB 9.7*  < > 8.5* 7.3* 8.5* 9.1*  8.0* 7.9* 7.9*  HCT 29.3*  --   --   --  26.1* 27.1*  --  24.5*  --   MCV 95.3  --   --   --  87.2 86.5  --  87.6  --   PLT 321  --   --   --  277 347  --  338  --   < > = values in this interval not displayed. :   ASSESSMENT AND PLAN:  Principal Problem:   A-fib Active Problems:   UTI (lower urinary tract infection)   Leukocytosis   HTN (hypertension)   DM (diabetes mellitus)   Blood loss anemia   Acute lower GI bleeding   Persistent atrial fibrillation  Relatively controlled atrial fibrillation Continue current mweds  We'll check a CBC.  Signed, Sherryl MangesSteven Allen Egerton MD  03/24/2015

## 2015-03-25 LAB — CBC
HEMATOCRIT: 23.8 % — AB (ref 35.0–47.0)
Hemoglobin: 7.9 g/dL — ABNORMAL LOW (ref 12.0–16.0)
MCH: 28.8 pg (ref 26.0–34.0)
MCHC: 33.1 g/dL (ref 32.0–36.0)
MCV: 87 fL (ref 80.0–100.0)
Platelets: 411 10*3/uL (ref 150–440)
RBC: 2.74 MIL/uL — AB (ref 3.80–5.20)
RDW: 20.5 % — ABNORMAL HIGH (ref 11.5–14.5)
WBC: 13.8 10*3/uL — ABNORMAL HIGH (ref 3.6–11.0)

## 2015-03-25 LAB — GLUCOSE, CAPILLARY
GLUCOSE-CAPILLARY: 151 mg/dL — AB (ref 65–99)
Glucose-Capillary: 193 mg/dL — ABNORMAL HIGH (ref 65–99)

## 2015-03-25 MED ORDER — FAMOTIDINE 40 MG PO TABS: 40.0000 mg | ORAL_TABLET | Freq: Every day | ORAL | Status: DC

## 2015-03-25 MED ORDER — ENSURE ENLIVE PO LIQD: 237.0000 mL | Freq: Three times a day (TID) | ORAL | Status: DC

## 2015-03-25 MED ORDER — DILTIAZEM HCL ER COATED BEADS 120 MG PO CP24: 120.0000 mg | ORAL_CAPSULE | Freq: Every day | ORAL | Status: DC

## 2015-03-25 NOTE — Discharge Instructions (Signed)
Atrial Fibrillation °Atrial fibrillation is a type of irregular heart rhythm (arrhythmia). During atrial fibrillation, the upper chambers of the heart (atria) quiver continuously in a chaotic pattern. This causes an irregular and often rapid heart rate.  °Atrial fibrillation is the result of the heart becoming overloaded with disorganized signals that tell it to beat. These signals are normally released one at a time by a part of the right atrium called the sinoatrial node. They then travel from the atria to the lower chambers of the heart (ventricles), causing the atria and ventricles to contract and pump blood as they pass. In atrial fibrillation, parts of the atria outside of the sinoatrial node also release these signals. This results in two problems. First, the atria receive so many signals that they do not have time to fully contract. Second, the ventricles, which can only receive one signal at a time, beat irregularly and out of rhythm with the atria.  °There are three types of atrial fibrillation:  °· Paroxysmal. Paroxysmal atrial fibrillation starts suddenly and stops on its own within a week. °· Persistent. Persistent atrial fibrillation lasts for more than a week. It may stop on its own or with treatment. °· Permanent. Permanent atrial fibrillation does not go away. Episodes of atrial fibrillation may lead to permanent atrial fibrillation. °Atrial fibrillation can prevent your heart from pumping blood normally. It increases your risk of stroke and can lead to heart failure.  °CAUSES  °· Heart conditions, including a heart attack, heart failure, coronary artery disease, and heart valve conditions.   °· Inflammation of the sac that surrounds the heart (pericarditis). °· Blockage of an artery in the lungs (pulmonary embolism). °· Pneumonia or other infections. °· Chronic lung disease. °· Thyroid problems, especially if the thyroid is overactive (hyperthyroidism). °· Caffeine, excessive alcohol use, and use  of some illegal drugs.   °· Use of some medicines, including certain decongestants and diet pills. °· Heart surgery.   °· Birth defects.   °Sometimes, no cause can be found. When this happens, the atrial fibrillation is called lone atrial fibrillation. The risk of complications from atrial fibrillation increases if you have lone atrial fibrillation and you are age 60 years or older. °RISK FACTORS °· Heart failure. °· Coronary artery disease. °· Diabetes mellitus.   °· High blood pressure (hypertension).   °· Obesity.   °· Other arrhythmias.   °· Increased age. °SIGNS AND SYMPTOMS  °· A feeling that your heart is beating rapidly or irregularly.   °· A feeling of discomfort or pain in your chest.   °· Shortness of breath.   °· Sudden light-headedness or weakness.   °· Getting tired easily when exercising.   °· Urinating more often than normal (mainly when atrial fibrillation first begins).   °In paroxysmal atrial fibrillation, symptoms may start and suddenly stop. °DIAGNOSIS  °Your health care provider may be able to detect atrial fibrillation when taking your pulse. Your health care provider may have you take a test called an ambulatory electrocardiogram (ECG). An ECG records your heartbeat patterns over a 24-hour period. You may also have other tests, such as: °· Transthoracic echocardiogram (TTE). During echocardiography, sound waves are used to evaluate how blood flows through your heart. °· Transesophageal echocardiogram (TEE). °· Stress test. There is more than one type of stress test. If a stress test is needed, ask your health care provider about which type is best for you. °· Chest X-ray exam. °· Blood tests. °· Computed tomography (CT). °TREATMENT  °Treatment may include: °· Treating any underlying conditions. For example, if you   have an overactive thyroid, treating the condition may correct atrial fibrillation.  Taking medicine. Medicines may be given to control a rapid heart rate or to prevent blood  clots, heart failure, or a stroke.  Having a procedure to correct the rhythm of the heart:  Electrical cardioversion. During electrical cardioversion, a controlled, low-energy shock is delivered to the heart through your skin. If you have chest pain, very low blood pressure, or sudden heart failure, this procedure may need to be done as an emergency.  Catheter ablation. During this procedure, heart tissues that send the signals that cause atrial fibrillation are destroyed.  Surgical ablation. During this surgery, thin lines of heart tissue that carry the abnormal signals are destroyed. This procedure can either be an open-heart surgery or a minimally invasive surgery. With the minimally invasive surgery, small cuts are made to access the heart instead of a large opening.  Pulmonary venous isolation. During this surgery, tissue around the veins that carry blood from the lungs (pulmonary veins) is destroyed. This tissue is thought to carry the abnormal signals. HOME CARE INSTRUCTIONS   Take medicines only as directed by your health care provider. Some medicines can make atrial fibrillation worse or recur.  If blood thinners were prescribed by your health care provider, take them exactly as directed. Too much blood-thinning medicine can cause bleeding. If you take too little, you will not have the needed protection against stroke and other problems.  Perform blood tests at home if directed by your health care provider. Perform blood tests exactly as directed.  Quit smoking if you smoke.  Do not drink alcohol.  Do not drink caffeinated beverages such as coffee, soda, and some teas. You may drink decaffeinated coffee, soda, or tea.   Maintain a healthy weight.Do not use diet pills unless your health care provider approves. They may make heart problems worse.   Follow diet instructions as directed by your health care provider.  Exercise regularly as directed by your health care  provider.  Keep all follow-up visits as directed by your health care provider. This is important. PREVENTION  The following substances can cause atrial fibrillation to recur:   Caffeinated beverages.  Alcohol.  Certain medicines, especially those used for breathing problems.  Certain herbs and herbal medicines, such as those containing ephedra or ginseng.  Illegal drugs, such as cocaine and amphetamines. Sometimes medicines are given to prevent atrial fibrillation from recurring. Proper treatment of any underlying condition is also important in helping prevent recurrence.  SEEK MEDICAL CARE IF:  You notice a change in the rate, rhythm, or strength of your heartbeat.  You suddenly begin urinating more frequently.  You tire more easily when exerting yourself or exercising. SEEK IMMEDIATE MEDICAL CARE IF:   You have chest pain, abdominal pain, sweating, or weakness.  You feel nauseous.  You have shortness of breath.  You suddenly have swollen feet and ankles.  You feel dizzy.  Your face or limbs feel numb or weak.  You have a change in your vision or speech. MAKE SURE YOU:   Understand these instructions.  Will watch your condition. [facility]

## 2015-03-25 NOTE — Clinical Social Work Note (Addendum)
CSW notified RN and pt's family Fayrene FearingJames (son) that pt would DC to [facility] via EMS, Daughter Becky [phone] was notified.  CSW also notified facility that pt would DC to them today.  CSW signing off.

## 2015-03-25 NOTE — Discharge Summary (Addendum)
[facility] - [place] at [facility]   PATIENT NAME: Madison EdwardsMarie Montoya    MR#:  [id number]  DATE OF BIRTH:  November 22, 1931  DATE OF ADMISSION:  03/16/2015 ADMITTING PHYSICIAN: Shaune PollackQing Chen, MD  DATE OF DISCHARGE:03/25/15  PRIMARY CARE PHYSICIAN: DR Lorin PicketSCOTT at [facility]    ADMISSION DIAGNOSIS:  UTI (lower urinary tract infection) [N39.0] Persistent atrial fibrillation [I48.1]  DISCHARGE DIAGNOSIS:  * Afib with RVR *anemia due to GI bleed likely Diverticular *UTI *Generalized Weakness  SECONDARY DIAGNOSIS:   Past Medical History  Diagnosis Date  . Hypertension   . Diabetes mellitus without complication   . Dementia   . Arthritis   . History of echocardiogram     a. echo 03/2015: EF 55-60%, LA/RA mildly dilated, no AI    HOSPITAL COURSE:    81 yo female w/ PMH of Dementia, HTN, Type II DM, hx of left hip fracture in 2013, who presented to the hospital due to a. Fib w/ RVR.   * A. Fib w/ RVR - pt. Presented to the hospital w/ HR in 160's.  - HR much improved and off cardizem gtt now.  - Cont. metoprolol, Cardizem, Dig and rates are stable.  - was on coumadin but now off indefinitely due to high fall risk, GI bleed.  - Echo done showing normal EF of 55 to 60%.   * Elevated Troponin - likely in the setting of demand ischemia from A. Fib w/ RVR - cont. B-blocker, Statin. Echo showing normal EF w/ no wall motion abnormalities.  -[facility]  * UTI  Completed rx with levaquin  * Dementia w/ behavioral disturbance - cont. PRN haldol.  - avoid Benzo's.   * Bloody stools/GI bleed - No bloody stools overnight.  - Hg. Stable likely diverticular bleeding - Cont. To hold coumadin. Hg. 7.9 this a.m.  - as per GI no plans on urgent colonoscopy or endoscopy and they have signed off.   * Anemia - acute blood loss anemia due to GI bleed - Hg. Stable  - cont to hold coumadin.   * HTN - cont. Enalapril, Metoprolol.   * DM - SSI.   Seen by  PT and they recommend SNF and family has agreed to discharge to SNF. -d/c to [facility] today -Seen by palliative care yesterday and remains a full code for now.  Spoke with son Aurther LoftJames Aycock  DISCHARGE CONDITIONS:   fair  CONSULTS OBTAINED:  Treatment Team:  Laurey Moralealton S McLean, MD Scot Junobert T Elliott, MD  Dr phifer  DRUG ALLERGIES:  No Known Allergies  DISCHARGE MEDICATIONS:   Current Discharge Medication List    START taking these medications   Details  diltiazem (CARDIZEM CD) 120 MG 24 hr capsule Take 1 capsule (120 mg total) by mouth at bedtime. Qty: 30 capsule, Refills: 0    famotidine (PEPCID) 40 MG tablet Take 1 tablet (40 mg total) by mouth at bedtime. Qty: 30 tablet, Refills: 0    feeding supplement, ENSURE ENLIVE, (ENSURE ENLIVE) LIQD Take 237 mLs by mouth 3 (three) times daily with meals. Qty: 237 mL, Refills: 12      CONTINUE these medications which have NOT CHANGED   Details  acetaminophen (TYLENOL) 500 MG tablet Take 500-1,000 mg by mouth 2 (two) times daily as needed for mild pain.     aspirin EC 81 MG tablet Take 81 mg by mouth every morning.    calcium-vitamin D (OSCAL WITH D) 500-200 MG-UNIT per tablet  Take 1 tablet by mouth every morning.    enalapril (VASOTEC) 5 MG tablet Take 5 mg by mouth 2 (two) times daily.    metoprolol (LOPRESSOR) 50 MG tablet Take 25 mg by mouth 2 (two) times daily.    simvastatin (ZOCOR) 20 MG tablet Take 20 mg by mouth every evening.         DISCHARGE INSTRUCTIONS:    As above  If you experience worsening of your admission symptoms, develop shortness of breath, life threatening emergency, suicidal or homicidal thoughts you must seek medical attention immediately by calling 911 or calling your MD immediately  if symptoms less severe.  You Must read complete instructions/literature along with all the possible adverse reactions/side effects for all the Medicines you take and that have been prescribed to you. Take any  new Medicines after you have completely understood and accept all the possible adverse reactions/side effects.   Please note  You were cared for by a hospitalist during your hospital stay. If you have any questions about your discharge medications or the care you received while you were in the hospital after you are discharged, you can call the unit and asked to speak with the hospitalist on call if the hospitalist that took care of you is not available. Once you are discharged, your primary care physician will handle any further medical issues. Please note that NO REFILLS for any discharge medications will be authorized once you are discharged, as it is imperative that you return to your primary care physician (or establish a relationship with a primary care physician if you do not have one) for your aftercare needs so that they can reassess your need for medications and monitor your lab values.    Today   SUBJECTIVE   Weak. No complaints. No bloody stools per RN   VITAL SIGNS:  Blood pressure 149/56, pulse 92, temperature 98 F (36.7 C), temperature source Oral, resp. rate 18, height  (1.575 m), weight 61.689 kg (136 lb), SpO2 98 %.  I/O:   Intake/Output Summary (Last 24 hours) at 03/25/15 0726 Last data filed at 03/25/15 0500  Gross per 24 hour  Intake    240 ml  Output   2800 ml  Net  -2560 ml    PHYSICAL EXAMINATION:  GENERAL:  81 y.o.-year-old patient lying in the bed with no acute distress.  EYES: Pupils equal, round, reactive to light and accommodation. No scleral icterus. Extraocular muscles intact.  HEENT: Head atraumatic, normocephalic. Oropharynx and nasopharynx clear.  NECK:  Supple, no jugular venous distention. No thyroid enlargement, no tenderness.  LUNGS: Normal breath sounds bilaterally, no wheezing, rales,rhonchi or crepitation. No use of accessory muscles of respiration.  CARDIOVASCULAR: S1, S2 normal. No murmurs, rubs, or gallops.  ABDOMEN: Soft,  non-tender, non-distended. Bowel sounds present. No organomegaly or mass.  EXTREMITIES: No pedal edema, cyanosis, or clubbing.  NEUROLOGIC: Cranial nerves II through XII are intact. Muscle strength 4/5 in all extremities. Sensation intact. Gait not checked.  -gen weakness PSYCHIATRIC: The patient is alert and awake SKIN: No obvious rash, lesion, or ulcer.   DATA REVIEW:   CBC  Recent Labs Lab 03/25/15 0352  WBC 13.8*  HGB 7.9*  HCT 23.8*  PLT 411    Chemistries   Recent Labs Lab 03/22/15 1020  NA 135  K 3.8  CL 103  CO2 24  GLUCOSE 142*  BUN 16  CREATININE 0.90  CALCIUM 9.3  MG 1.9    Cardiac Enzymes No results  for input(s): TROPONINI in the last 168 hours.  Microbiology Results  Results for orders placed or performed in visit on 09/20/12  Urine culture     Status: None   Collection Time: 10/12/12  2:00 AM  Result Value Ref Range Status   Micro Text Report   Final       SOURCE: UNKNOWN    COMMENT                   MIXED BACTERIAL ORGANISMS   COMMENT                   RESULTS SUGGESTIVE OF CONTAMINATION   ANTIBIOTIC                                                        Management plans discussed with the patient, family and they are in agreement.  CODE STATUS:     Code Status Orders        Start     Ordered   03/16/15 2046  Full code   Continuous     03/16/15 2045      TOTAL TIME TAKING CARE OF THIS PATIENT: 35minutes.    Kedra Mcglade M.D on 03/25/2015 at 7:26 AM  Between 7am to 6pm - Pager - [phone] After 6pm go to [email or web address] - password [email or web address] [facility]  [facility]  Office  [phone]  CC: Primary care physician; [facility]AL [facility] CLINIC

## 2015-03-25 NOTE — Progress Notes (Signed)
Discharge instructions provided to pt./ family member, verbalizes understanding. IV and telemetry box removed. Pt tolerated well. Pt. discharged via escort personal. Denies pain or discomfort

## 2015-03-26 DIAGNOSIS — I2510 Atherosclerotic heart disease of native coronary artery without angina pectoris: Secondary | ICD-10-CM | POA: Diagnosis not present

## 2015-03-26 DIAGNOSIS — F0150 Vascular dementia without behavioral disturbance: Secondary | ICD-10-CM

## 2015-03-26 DIAGNOSIS — E1142 Type 2 diabetes mellitus with diabetic polyneuropathy: Secondary | ICD-10-CM | POA: Diagnosis not present

## 2015-03-26 DIAGNOSIS — I482 Chronic atrial fibrillation: Secondary | ICD-10-CM | POA: Diagnosis not present

## 2015-03-26 DIAGNOSIS — K922 Gastrointestinal hemorrhage, unspecified: Secondary | ICD-10-CM | POA: Diagnosis not present

## 2015-03-27 ENCOUNTER — Telehealth: Payer: Self-pay | Admitting: Cardiovascular Disease

## 2015-03-27 NOTE — Telephone Encounter (Signed)
Daughter called to verify appt. Cannot release information as patient does not have a dpr on file.  Patient daughter was very agitated that I could not share appt. Information and was offered to speak with someone about hippa compliance regulations.  Daughter is concerned that appt. Will be missed and asked that we notifiy [facility] of appt. So that they may arrange transport. [facility] to notify of appt. Time.

## 2015-03-28 LAB — TYPE AND SCREEN
ABO/RH(D): A POS
ANTIBODY SCREEN: NEGATIVE
Unit division: 0

## 2015-04-03 ENCOUNTER — Ambulatory Visit (INDEPENDENT_AMBULATORY_CARE_PROVIDER_SITE_OTHER): Payer: Medicare Other | Admitting: Cardiovascular Disease

## 2015-04-03 ENCOUNTER — Encounter: Payer: Self-pay | Admitting: Cardiovascular Disease

## 2015-04-03 VITALS — BP 100/60 | HR 81 | Ht 60.0 in | Wt 124.0 lb

## 2015-04-03 DIAGNOSIS — E119 Type 2 diabetes mellitus without complications: Secondary | ICD-10-CM | POA: Diagnosis not present

## 2015-04-03 DIAGNOSIS — I4819 Other persistent atrial fibrillation: Secondary | ICD-10-CM

## 2015-04-03 DIAGNOSIS — I10 Essential (primary) hypertension: Secondary | ICD-10-CM | POA: Diagnosis not present

## 2015-04-03 DIAGNOSIS — K922 Gastrointestinal hemorrhage, unspecified: Secondary | ICD-10-CM

## 2015-04-03 DIAGNOSIS — N390 Urinary tract infection, site not specified: Secondary | ICD-10-CM

## 2015-04-03 DIAGNOSIS — I481 Persistent atrial fibrillation: Secondary | ICD-10-CM | POA: Diagnosis not present

## 2015-04-03 DIAGNOSIS — D500 Iron deficiency anemia secondary to blood loss (chronic): Secondary | ICD-10-CM

## 2015-04-03 NOTE — Assessment & Plan Note (Signed)
Anticoagulation was held in the hospital given a high fall risk, recent GI bleed, anemia

## 2015-04-03 NOTE — Assessment & Plan Note (Signed)
Blood pressures running very low. We have recommended she hold the enalapril

## 2015-04-03 NOTE — Assessment & Plan Note (Signed)
Heart rate well controlled on today's visit. No changes to her diltiazem or metoprolol

## 2015-04-03 NOTE — Assessment & Plan Note (Signed)
Recent GI bleed in the hospital. Etiology unclear, possibly diverticular and hemorrhoidal. We have discussed this with family. We will not restart anticoagulation. They are in agreement

## 2015-04-03 NOTE — Assessment & Plan Note (Signed)
Recently required Levaquin for UTI, hospitalization. This resulted in nature fibrillation with RVR

## 2015-04-03 NOTE — Patient Instructions (Signed)
Please stop the enalapril, blood pressure is low Continue other medications  Tylenol for pain as you are doing Encourage po intake given weight loss, anorexia  Please call us if you have new issues that need to be addressed before your next appt.  Your physician wants you to follow-up in: 6 months.  You will receive a reminder letter in the mail two months in advance. If you don't receive a letter, please call our office to schedule the follow-up appointment.

## 2015-04-03 NOTE — Assessment & Plan Note (Signed)
Dramatic weight loss over the past month or so. Sugars will likely be less of an issue

## 2015-04-03 NOTE — Progress Notes (Signed)
Patient ID: Yevette EdwardsMarie Dike, female    DOB: 03-14-1932, 81 y.o.   MRN: [id number]  HPI Comments: Ms. Kamer is a 81 year old woman with history of dementia, chronic atrial fibrillation, presenting to the hospital in April 2016 with urinary tract infection, atrial fibrillation with RVR. She presents to establish care in the [place] office.  She was managed with antibiotic, Levaquin for UTI She had an episode of profound bradycardia in the hospital felt secondary to vasovagal etiology She is managed with diltiazem for heart rate control She was noted to have anemia, history of GI bleed, anticoagulation was not continued. Notes indicate transfusion of one unit in the hospital prior to discharge  She presents today from skilled nursing facility Family reports she has anorexia and weight loss Most recent hemoglobin 7.9 She denies any tachycardia, leg edema. Blood pressure has been running low. Family reports she has had some bad days at times, weakness, lightheadedness  EKG on today's visit shows atrial fibrillation with ventricular rate 81 bpm, no significant ST or T-wave changes  Other past medical history Echocardiogram in the hospital with ejection fraction 55-60%     Allergies  Allergen Reactions  . Oxycodone-Acetaminophen     Other reaction(s): Unknown  . Sulfa Antibiotics Other (See Comments)    Swelling of throat and mouth    Current Outpatient Prescriptions on File Prior to Visit  Medication Sig Dispense Refill  . acetaminophen (TYLENOL) 500 MG tablet Take 500-1,000 mg by mouth 2 (two) times daily as needed for mild pain.     Marland Kitchen. aspirin EC 81 MG tablet Take 81 mg by mouth every morning.    . diltiazem (CARDIZEM CD) 120 MG 24 hr capsule Take 1 capsule (120 mg total) by mouth at bedtime. 30 capsule 0  . enalapril (VASOTEC) 5 MG tablet Take 5 mg by mouth 2 (two) times daily.    . feeding supplement, ENSURE ENLIVE, (ENSURE ENLIVE) LIQD Take 237 mLs by mouth 3 (three) times  daily with meals. 237 mL 12  . metoprolol (LOPRESSOR) 50 MG tablet Take 25 mg by mouth 2 (two) times daily.    . simvastatin (ZOCOR) 20 MG tablet Take 20 mg by mouth every evening.     No current facility-administered medications on file prior to visit.    Past Medical History  Diagnosis Date  . Hypertension   . Diabetes mellitus without complication   . Dementia   . Arthritis   . History of echocardiogram     a. echo 03/2015: EF 55-60%, LA/RA mildly dilated, no AI    Past Surgical History  Procedure Laterality Date  . Fracture surgery    . Cardiac catheterization      [facility]    Social History  reports that she has never smoked. She does not have any smokeless tobacco history on file. She reports that she does not drink alcohol.  Family History Family history is unknown by patient.      Review of Systems  Constitutional: Positive for fatigue.  Respiratory: Negative.   Cardiovascular: Negative.   Gastrointestinal: Negative.   Musculoskeletal: Positive for gait problem.  Neurological: Positive for weakness.  Hematological: Negative.   Psychiatric/Behavioral: Positive for confusion.  All other systems reviewed and are negative.  patient is a poor historian  BP 100/60 mmHg  Pulse 81  Ht 5' (1.524 m)  Wt 124 lb (56.246 kg)  BMI 24.22 kg/m2  Physical Exam  Constitutional: She is oriented to person, place, and time. She appears well-developed  and well-nourished.  HENT:  Head: Normocephalic.  Nose: Nose normal.  Mouth/Throat: Oropharynx is clear and moist.  Eyes: Conjunctivae are normal. Pupils are equal, round, and reactive to light.  Neck: Normal range of motion. Neck supple. No JVD present.  Cardiovascular: Normal rate, normal heart sounds and intact distal pulses.  An irregularly irregular rhythm present. Exam reveals no gallop and no friction rub.   No murmur heard. Pulmonary/Chest: Effort normal and breath sounds normal. No respiratory distress. She has no  wheezes. She has no rales. She exhibits no tenderness.  Abdominal: Soft. Bowel sounds are normal. She exhibits no distension. There is no tenderness.  Musculoskeletal: Normal range of motion. She exhibits no edema or tenderness.  Lymphadenopathy:    She has no cervical adenopathy.  Neurological: She is alert and oriented to person, place, and time. Coordination normal.  Skin: Skin is warm and dry. No rash noted. No erythema.  Psychiatric: She has a normal mood and affect. Her behavior is normal. Judgment and thought content normal.

## 2015-04-10 ENCOUNTER — Telehealth: Payer: Self-pay

## 2015-04-10 NOTE — Telephone Encounter (Signed)
PLEASE NOTE: All timestamps contained within this report are represented as [place] Standard Time. CONFIDENTIALTY NOTICE: This fax transmission is intended only for the addressee. It contains information that is legally privileged, confidential or otherwise protected from use or disclosure. If you are not the intended recipient, you are strictly prohibited from reviewing, disclosing, copying using or disseminating any of this information or taking any action in reliance on or regarding this information. If you have received this fax in error, please notify us immediately by telephone so that we can arrange for its return to us. Phone: [phone], Toll-Free: [phone], Fax: [phone] Page: 1 of 1 Call Id: [id number] [place] [facility] - Client TELEPHONE ADVICE RECORD [facility] Patient Name: Madison Montoya Gender: Female DOB: 01/19/1932 Age: [age] Y 1 M 8 D Return Phone Number: Address: City/State/Zip: [place] StatisticianClient [facility] [facility] - Client Client Site [facility] - Night Physician Tillman AbideLetvak, Richard Contact Type Call Call Type Page Only Caller Name Madison DissWendy Montoya Relationship To Patient Provider Is this call to report lab results? No Return Phone Number Please choose phone number Initial Comment Caller states she is [name]. Patient is scared and won't stay in bed. Patient has history of panic attacks but has no standing order for anxiety medication. [phone] Nurse Assessment Guidelines Guideline Title Affirmed Question Affirmed Notes Nurse Date/Time Lamount Cohen(Eastern Time) Disp. Time Lamount Cohen(Eastern Time) Disposition Final User 04/10/2015 12:59:35 AM [facility] to [facility] Paging Queue Macarthur CritchleyCaito, Holly 04/10/2015 1:03:19 AM Paged On Call back to Call Center - PC MiltonWood, Amy 04/10/2015 1:23:20 AM Called On-Call Provider Antonieta IbaHodge, Shannon 04/10/2015 1:24:36 AM Page Completed Yes Antonieta IbaHodge, Shannon After Care Instructions  Given Call Event Type User Date / Time Description Paging DoctorName Phone DateTime Result/Outcome Message Type Notes Sanda LingerJones, Thomas [phone] 04/10/2015 1:03:19 AM Paged On Call Back to Call Center Doctor Paged Please call Amy @ [facility]. [phone] Sanda LingerJones, Thomas [phone] 04/10/2015 1:23:20 AM Called On Call Provider - Reached Doctor Paged Sanda LingerJones, Thomas 04/10/2015 1:23:25 AM Spoke with On Call - General Message Result

## 2015-04-10 NOTE — Telephone Encounter (Signed)
I just restarted her citalopram

## 2015-04-15 ENCOUNTER — Telehealth: Payer: Self-pay

## 2015-04-15 DIAGNOSIS — F0151 Vascular dementia with behavioral disturbance: Secondary | ICD-10-CM | POA: Diagnosis not present

## 2015-04-15 DIAGNOSIS — E119 Type 2 diabetes mellitus without complications: Secondary | ICD-10-CM

## 2015-04-15 DIAGNOSIS — I2510 Atherosclerotic heart disease of native coronary artery without angina pectoris: Secondary | ICD-10-CM

## 2015-04-15 DIAGNOSIS — F411 Generalized anxiety disorder: Secondary | ICD-10-CM

## 2015-04-15 DIAGNOSIS — F29 Unspecified psychosis not due to a substance or known physiological condition: Secondary | ICD-10-CM | POA: Diagnosis not present

## 2015-04-15 NOTE — Telephone Encounter (Signed)
I saw her today along with her family and adjusted medications

## 2015-04-15 NOTE — Telephone Encounter (Signed)
PLEASE NOTE: All timestamps contained within this report are represented as [place] Standard Time. CONFIDENTIALTY NOTICE: This fax transmission is intended only for the addressee. It contains information that is legally privileged, confidential or otherwise protected from use or disclosure. If you are not the intended recipient, you are strictly prohibited from reviewing, disclosing, copying using or disseminating any of this information or taking any action in reliance on or regarding this information. If you have received this fax in error, please notify us immediately by telephone so that we can arrange for its return to us. Phone: [phone], Toll-Free: [phone], Fax: [phone] Page: 1 of 1 Call Id: [id number] [place] [facility] - Client TELEPHONE ADVICE RECORD [facility] Patient Name: Madison EdwardsMARIE Eisenhardt Gender: Female DOB: July 17, 1932 Age: [age] Y 1 M 11 D Return Phone Number: Address: City/State/Zip: [place] StatisticianClient [place] [facility] Night - Client Client Site [facility] - Night Physician Tillman AbideLetvak, Richard Contact Type Call Call Type Page Only Caller Name Judeth CornfieldStephanie Relationship To Patient Provider Is this call to report lab results? No Return Phone Number Please choose phone number Initial Comment Caller states Judeth CornfieldStephanie with [facility]. CB# [phone]. States that patient is very anxious requesting orders. Nurse Assessment Guidelines Guideline Title Affirmed Question Affirmed Notes Nurse Date/Time (Eastern Time) Disp. Time Lamount Cohen(Eastern Time) Disposition Final User 04/13/2015 4:13:26 PM Send to [facility] Paging Queue Lorin MercySchmidt, Fern 04/13/2015 4:16:16 PM Paged On Call to Other Provider Meryle ReadyBerezansky, Jacob 04/13/2015 4:16:35 PM Page Completed Yes Meryle ReadyBerezansky, Jacob After Care Instructions Given Call Event Type User Date / Time Description Paging DoctorName Phone DateTime Result/Outcome Message Type Notes Gershon CraneFry, Stephen  [phone] 04/13/2015 4:16:16 PM Paged On Call to Other Provider Doctor Paged Pleast call Stpehanie at [phone] regarding Dellis AnesMarie Gutmann Fry, Stephen 04/13/2015 4:16:20 PM Paged On Call to Another Provider Message Result

## 2015-04-15 NOTE — Telephone Encounter (Signed)
PLEASE NOTE: All timestamps contained within this report are represented as [place] Standard Time. CONFIDENTIALTY NOTICE: This fax transmission is intended only for the addressee. It contains information that is legally privileged, confidential or otherwise protected from use or disclosure. If you are not the intended recipient, you are strictly prohibited from reviewing, disclosing, copying using or disseminating any of this information or taking any action in reliance on or regarding this information. If you have received this fax in error, please notify us immediately by telephone so that we can arrange for its return to us. Phone: [phone], Toll-Free: [phone], Fax: [phone] Page: 1 of 1 Call Id: [id number] [place] [facility] - Client TELEPHONE ADVICE RECORD [facility] Patient Name: Madison Montoya Gender: Female DOB: 01/01/1932 Age: [age] Y 1 M 12 D Return Phone Number: Address: City/State/Zip: [place] StatisticianClient [place] [facility] Night - Client Client [facility] - Night Physician Tillman AbideLetvak, Richard Contact Type Call Call Type Page Only Caller Name OildaleStefanie- RN @ [facility] Relationship To Patient Provider Is this call to report lab results? No Return Phone Number Please choose phone number Initial Comment Caller states she is Stefanie an Charity fundraiserN at [facility]. Patient is agitated. Prescription is not working. CB#[phone] Nurse Assessment Guidelines Guideline Title Affirmed Question Affirmed Notes Nurse Date/Time (Eastern Time) Disp. Time Lamount Cohen(Eastern Time) Disposition Final User 04/13/2015 9:36:22 PM [facility] to [facility] Paging Queue Dorene SorrowChambers, Shelby 04/13/2015 9:40:54 PM Paged On Call to Other Provider Meryle ReadyBerezansky, Jacob 04/13/2015 9:41:11 PM Page Completed Yes Meryle ReadyBerezansky, Jacob After Care Instructions Given Call Event Type User Date / Time Description Paging DoctorName Phone DateTime Result/Outcome Message Type  Notes Gershon CraneFry, Stephen [phone] 04/13/2015 9:40:54 PM Paged On Call to Other Provider Doctor Paged PLease call Stefanie with [facility] at [phone] regarding Madison EdwardsMarie Himes. Gershon CraneFry, Stephen 04/13/2015 9:40:59 PM Paged On Call to Another Provider Message Result

## 2015-04-29 DIAGNOSIS — F411 Generalized anxiety disorder: Secondary | ICD-10-CM | POA: Diagnosis not present

## 2015-05-07 ENCOUNTER — Encounter: Payer: Medicare Other | Admitting: Cardiovascular Disease

## 2015-05-08 ENCOUNTER — Emergency Department
Admission: EM | Admit: 2015-05-08 | Discharge: 2015-05-08 | Disposition: A | Discharge status: Home / Self Care | Payer: Medicare Other | Attending: Emergency Medicine | Admitting: Emergency Medicine

## 2015-05-08 ENCOUNTER — Telehealth: Payer: Self-pay

## 2015-05-08 ENCOUNTER — Other Ambulatory Visit: Payer: Self-pay

## 2015-05-08 ENCOUNTER — Emergency Department: Payer: Medicare Other

## 2015-05-08 DIAGNOSIS — E119 Type 2 diabetes mellitus without complications: Secondary | ICD-10-CM | POA: Insufficient documentation

## 2015-05-08 DIAGNOSIS — I10 Essential (primary) hypertension: Secondary | ICD-10-CM | POA: Diagnosis not present

## 2015-05-08 DIAGNOSIS — F0391 Unspecified dementia with behavioral disturbance: Secondary | ICD-10-CM

## 2015-05-08 DIAGNOSIS — R079 Chest pain, unspecified: Secondary | ICD-10-CM | POA: Diagnosis present

## 2015-05-08 LAB — TROPONIN I
TROPONIN I: 0.06 ng/mL — AB (ref <0.031)
Troponin I: 0.05 ng/mL — ABNORMAL HIGH (ref <0.031)

## 2015-05-08 LAB — COMPREHENSIVE METABOLIC PANEL
ALT: 12 U/L — AB (ref 14–54)
AST: 25 U/L (ref 15–41)
Albumin: 3.1 g/dL — ABNORMAL LOW (ref 3.5–5.0)
Alkaline Phosphatase: 73 U/L (ref 38–126)
Anion gap: 8 (ref 5–15)
BILIRUBIN TOTAL: 0.5 mg/dL (ref 0.3–1.2)
BUN: 25 mg/dL — ABNORMAL HIGH (ref 6–20)
CALCIUM: 9.5 mg/dL (ref 8.9–10.3)
CO2: 23 mmol/L (ref 22–32)
Chloride: 99 mmol/L — ABNORMAL LOW (ref 101–111)
Creatinine, Ser: 1.27 mg/dL — ABNORMAL HIGH (ref 0.44–1.00)
GFR calc Af Amer: 45 mL/min — ABNORMAL LOW (ref >60)
GFR, EST NON AFRICAN AMERICAN: 38 mL/min — AB (ref >60)
Glucose, Bld: 184 mg/dL — ABNORMAL HIGH (ref 65–99)
Potassium: 3.9 mmol/L (ref 3.5–5.1)
Sodium: 130 mmol/L — ABNORMAL LOW (ref 135–145)
Total Protein: 6.7 g/dL (ref 6.5–8.1)

## 2015-05-08 LAB — URINALYSIS COMPLETE WITH MICROSCOPIC (ARMC ONLY)
BACTERIA UA: NONE SEEN
BILIRUBIN URINE: NEGATIVE
Glucose, UA: 50 mg/dL — AB
Hgb urine dipstick: NEGATIVE
KETONES UR: NEGATIVE mg/dL
Leukocytes, UA: NEGATIVE
Nitrite: NEGATIVE
Protein, ur: NEGATIVE mg/dL
RBC / HPF: NONE SEEN RBC/hpf (ref 0–5)
SPECIFIC GRAVITY, URINE: 1.013 (ref 1.005–1.030)
pH: 5 (ref 5.0–8.0)

## 2015-05-08 LAB — CBC
HEMATOCRIT: 26.6 % — AB (ref 35.0–47.0)
HEMOGLOBIN: 8.6 g/dL — AB (ref 12.0–16.0)
MCH: 25.6 pg — ABNORMAL LOW (ref 26.0–34.0)
MCHC: 32.4 g/dL (ref 32.0–36.0)
MCV: 79 fL — ABNORMAL LOW (ref 80.0–100.0)
PLATELETS: 557 10*3/uL — AB (ref 150–440)
RBC: 3.37 MIL/uL — ABNORMAL LOW (ref 3.80–5.20)
RDW: 18 % — ABNORMAL HIGH (ref 11.5–14.5)
WBC: 8.2 10*3/uL (ref 3.6–11.0)

## 2015-05-08 MED ORDER — LORAZEPAM 1 MG PO TABS: 1.0000 mg | ORAL_TABLET | Freq: Three times a day (TID) | ORAL | Status: DC | PRN

## 2015-05-08 MED ORDER — LORAZEPAM 2 MG/ML IJ SOLN
INTRAMUSCULAR | Status: AC
  Filled 2015-05-08: qty 1

## 2015-05-08 MED ORDER — LORAZEPAM 2 MG/ML IJ SOLN: 1.0000 mg | Freq: Once | INTRAMUSCULAR | Status: DC

## 2015-05-08 MED ORDER — LORAZEPAM 2 MG/ML IJ SOLN
0.5000 mg | Freq: Once | INTRAMUSCULAR | Status: AC
  Administered 2015-05-08: 0.5 mg via INTRAVENOUS

## 2015-05-08 MED ORDER — LORAZEPAM 2 MG/ML IJ SOLN
1.0000 mg | Freq: Once | INTRAMUSCULAR | Status: AC
  Administered 2015-05-08: 1 mg via INTRAMUSCULAR

## 2015-05-08 MED ORDER — LORAZEPAM 2 MG/ML IJ SOLN
INTRAMUSCULAR | Status: AC
  Administered 2015-05-08: 0.5 mg via INTRAVENOUS
  Filled 2015-05-08: qty 1

## 2015-05-08 MED ORDER — LORAZEPAM 2 MG/ML IJ SOLN: 0.5000 mg | Freq: Once | INTRAMUSCULAR | Status: DC

## 2015-05-08 NOTE — ED Notes (Signed)
Lab called to report elevated troponin of 0.05. Dr. Mayford KnifeWilliams notified. Received verbal orders

## 2015-05-08 NOTE — ED Notes (Signed)
Patient to ED via [facility] for complaint of chest pain. Patient's baseline is normally confused. Patient will state CHest pain but is unable to state where the pain is located.

## 2015-05-08 NOTE — Discharge Instructions (Signed)

## 2015-05-08 NOTE — Telephone Encounter (Signed)
PLEASE NOTE: All timestamps contained within this report are represented as [place] Standard Time. CONFIDENTIALTY NOTICE: This fax transmission is intended only for the addressee. It contains information that is legally privileged, confidential or otherwise protected from use or disclosure. If you are not the intended recipient, you are strictly prohibited from reviewing, disclosing, copying using or disseminating any of this information or taking any action in reliance on or regarding this information. If you have received this fax in error, please notify us immediately by telephone so that we can arrange for its return to [place]. Phone: [phone], Toll-Free: [phone], Fax: [phone] Page: 1 of 2 Call Id: [id number] [facility] Night - Client TELEPHONE ADVICE RECORD [facility] Patient Name: Madison Montoya Gender: Female DOB: [birth year] Age: 49 Y 2 M 5 D Return Phone Number: Address: City/State/Zip: [place] Client [facility] Night - Client Client Site  [facility] Night Physician Tillman Abide Contact Type Call Call Type Triage / Clinical Caller Name Dewayne Hatch a/ [facility] Relationship To Patient Provider Return Phone Number Please choose phone number Chief Complaint CHEST PAIN (>=21 years) - pain, pressure, heaviness or tightness Initial Comment [place] [facility] @ [phone], states patient is c/o sharp chest pains. She is very anxious. V/S 110/70 BP, 82 P, 98% SPO2 PreDisposition Call Doctor Nurse Assessment Nurse: Gasper Sells, RN, Marylu Lund Date/Time Lamount Cohen Time): 05/07/2015 11:11:56 PM Confirm and document reason for call. If symptomatic, describe symptoms. ---Clint Bolder w/ [facility] @ [phone], states patient is c/o sharp chest pains. She is very anxious. V/S 110/70 BP, 82 P, 98% SPO2. Had Resperidone 0.5mg . D/n help. Took away the ativan. c/o 3x in 2 min. Wild in the wheelchair  and walking around Has the patient traveled out of the country within the last 30 days? ---Not Applicable Does the patient require triage? ---Yes Related visit to physician within the last 2 weeks? ---Yes Does the PT have any chronic conditions? (i.e. diabetes, asthma, etc.) ---Yes List chronic conditions. ---HTN, IDDM, arthritis, heart issues, afib, anemia, generalized weakness. Guidelines Guideline Title Affirmed Question Affirmed Notes Nurse Date/Time Lamount Cohen Time) Chest Pain [1] Chest pain lasts > 5 minutes AND [2] history of heart disease (i.e., heart attack, bypass surgery, angina, angioplasty, CHF; not just a heart murmur) Quentin Cornwall 05/07/2015 11:15:22 PM Disp. Time Lamount Cohen Time) Disposition Final User 05/07/2015 11:08:01 PM Send to Urgent Valere Dross, Amy 05/07/2015 11:24:41 PM Paged On Call back to [facility], RN, Marylu Lund PLEASE NOTE: All timestamps contained within this report are represented as [place] Standard Time. CONFIDENTIALTY NOTICE: This fax transmission is intended only for the addressee. It contains information that is legally privileged, confidential or otherwise protected from use or disclosure. If you are not the intended recipient, you are strictly prohibited from reviewing, disclosing, copying using or disseminating any of this information or taking any action in reliance on or regarding this information. If you have received this fax in error, please notify us immediately by telephone so that we can arrange for its return to [place]. Phone: [phone], Toll-Free: [phone], Fax: [phone] Page: 2 of 2 Call Id: [id number] Disp. Time Lamount Cohen Time) Disposition Final User 05/07/2015 11:26:28 PM [facility] Provider [place], RN, Marylu Lund 05/07/2015 11:28:25 PM 911 Outcome Documentation Gasper Sells, RN, Marylu Lund Reason: nurse calling 911 to transport 05/07/2015 11:28:38 PM Call Completed Quentin Cornwall 05/07/2015 11:16:04 PM Call EMS 911  Now Yes Gasper Sells, RN, Herbert Pun Understands: Yes Disagree/Comply: Comply Care Advice  Given Per Guideline CALL EMS 911 NOW: Immediate medical attention is needed. You need to hang up and call 911 (or an ambulance). Probation officer(Triager Discretion: I'll call you back in a few minutes to be sure you were able to reach them.) CARE ADVICE given per Chest Pain (Adult) guideline. After Care Instructions Given Call Event Type User Date / Time Description Paging DoctorName Phone DateTime Result/Outcome Message Type Notes Doreen BeamYoo, Doe Hyun Robert "Rob" [phone] 05/07/2015 11:24:41 PM Called On Call Provider - Left Message Doctor Paged Doreen BeamYoo, Doe Hyun Robert "Rob" [phone] 05/07/2015 11:26:28 PM Called On Call Provider - Reached Doctor Paged Artist PaisYoo, Doe Glenna FellowsHyun Robert "Rob" 05/07/2015 11:27:47 PM Spoke with On Call - Outcome Notification Message Result Verbal order to call 911 for pt c/o chest pain to J.Claiborne,RN. [facility] given telephone order

## 2015-05-08 NOTE — ED Provider Notes (Signed)
[facility] Emergency Department Provider Note     Time seen: ----------------------------------------- 7:37 PM on 05/08/2015 -----------------------------------------    I have reviewed the triage vital signs and the nursing notes. L5 caveat: Review of systems and history is difficult to obtain due to history of dementia.  HISTORY  Chief Complaint Chest Pain    HPI Madison EdwardsMarie Montoya is a 81 y.o. female who [place] for complaint of [place] pain. Patient's baseline is normally confused due to her history of dementia. Patient will state chest hurts but is unable to stay with pain is located.Patient states "she is having pain all over the house" and motions to her entire body.   Past Medical History  Diagnosis Date  . Hypertension   . Diabetes mellitus without complication   . Dementia   . Arthritis   . History of echocardiogram     a. echo 03/2015: EF 55-60%, LA/RA mildly dilated, no AI    Patient Active Problem List   Diagnosis Date Noted  . Blood loss anemia 03/19/2015  . Acute lower GI bleeding 03/19/2015  . Persistent atrial fibrillation   . A-fib 03/16/2015  . UTI (lower urinary tract infection) 03/16/2015  . Leukocytosis 03/16/2015  . HTN (hypertension) 03/16/2015  . DM (diabetes mellitus) 03/16/2015    Past Surgical History  Procedure Laterality Date  . Fracture surgery    . Cardiac catheterization      [facility]    Allergies Oxycodone-acetaminophen and Sulfa antibiotics  Social History History  Substance Use Topics  . Smoking status: Never Smoker   . Smokeless tobacco: Not on file  . Alcohol Use: No    Review of Systems Constitutional: Negative for fever. Eyes: Negative for visual changes. ENT: Negative for sore throat. Cardiovascular: Positive for chest pain Respiratory: Negative for shortness of breath. Gastrointestinal: Negative for abdominal pain, vomiting and diarrhea. Genitourinary: Negative for  dysuria. Musculoskeletal: Negative for back pain. Skin: Negative for rash.   10-point ROS otherwise negative.  ____________________________________________   PHYSICAL EXAM:  VITAL SIGNS: ED Triage Vitals  Enc Vitals Group     BP 05/08/15 1931 124/76 mmHg     Pulse Rate 05/08/15 1931 125     Resp 05/08/15 1931 20     Temp --      Temp src --      SpO2 --      Weight 05/08/15 1931 130 lb (58.968 kg)     Height 05/08/15 1931 5\' 4"  (1.626 m)     Head Cir --      Peak Flow --      Pain Score --      Pain Loc --      Pain Edu? --      Excl. in GC? --     Constitutional: Alert But disoriented. Mild distress and moderately anxious Eyes: Conjunctivae are normal. PERRL. Normal extraocular movements. ENT   Head: Normocephalic and atraumatic.   Nose: No congestion/rhinnorhea.   Mouth/Throat: Mucous membranes are moist.   Neck: No stridor. Cardiovascular: Rapid rate, irregular rhythm, symmetric distal pulses are present in all extremities. No murmurs, rubs, or gallops. Respiratory: Normal respiratory effort without tachypnea nor retractions. Breath sounds are clear and equal bilaterally. No wheezes/rales/rhonchi. Gastrointestinal: Soft and nontender. No distention. No abdominal bruits.  Musculoskeletal: Nontender with normal range of motion in all extremities. No joint effusions.  No lower extremity tenderness nor edema. Neurologic: Speech is clear but nonsensical. No gross focal neurologic deficits are  appreciated.  Skin:  Skin is warm, dry and intact. No rash noted. Psychiatric: Patient moaning repetitively and stating things that are nonsensical ____________________________________________  EKG: Interpreted byatrial fibrillation with a rapid ventricular response. Rate is 122 bpm. Normal axis. No evidence of hypertrophy.  ____________________________________________  ED COURSE:  Pertinent labs & imaging results that were available during my care of the patient  were reviewed by me and considered in my medical decision making (see chart for details).  patient will get cardiac labs and reevaluation.  ____________________________________________    LABS (pertinent positives/negatives)  Labs Reviewed  CBC - Abnormal; Notable for the following:    RBC 3.37 (*)    Hemoglobin 8.6 (*)    HCT 26.6 (*)    MCV 79.0 (*)    MCH 25.6 (*)    RDW 18.0 (*)    Platelets 557 (*)    All other components within normal limits  TROPONIN I - Abnormal; Notable for the following:    Troponin I 0.05 (*)    All other components within normal limits  COMPREHENSIVE METABOLIC PANEL - Abnormal; Notable for the following:    Sodium 130 (*)    Chloride 99 (*)    Glucose, Bld 184 (*)    BUN 25 (*)    Creatinine, Ser 1.27 (*)    Albumin 3.1 (*)    ALT 12 (*)    GFR calc non Af Amer 38 (*)    GFR calc Af Amer 45 (*)    All other components within normal limits  TROPONIN I - Abnormal; Notable for the following:    Troponin I 0.06 (*)    All other components within normal limits  URINALYSIS COMPLETEWITH MICROSCOPIC ([facility] ONLY) - Abnormal; Notable for the following:    Color, Urine YELLOW (*)    APPearance CLEAR (*)    Glucose, UA 50 (*)    Squamous Epithelial / LPF 0-5 (*)    All other components within normal limits    RADIOLOGY chest x-ray  IMPRESSION: Stable chest x-ray without evidence of acute cardiopulmonary process. _______________________________________  FINAL ASSESSMENT AND PLAN  chest pain and dementia   Plan:Patient with labs and x-rays as dictated above. No significant interval change in her troponins. They are chronically mildly elevated. She has severe dementia, wouldn't advocate for increase Ativan as needed. Family is agreeable, she stable for outpatient follow-up    .Emily Filbert, MD   Emily Filbert, MD 05/08/15 [phone]

## 2015-05-08 NOTE — Telephone Encounter (Signed)
Madison KocherRegina should be able to follow up with her today [facility]

## 2015-05-09 ENCOUNTER — Telehealth: Payer: Self-pay

## 2015-05-09 DIAGNOSIS — R451 Restlessness and agitation: Secondary | ICD-10-CM

## 2015-05-09 DIAGNOSIS — F411 Generalized anxiety disorder: Secondary | ICD-10-CM | POA: Diagnosis not present

## 2015-05-09 NOTE — Telephone Encounter (Signed)
PLEASE NOTE: All timestamps contained within this report are represented as [place] Standard Time. CONFIDENTIALTY NOTICE: This fax transmission is intended only for the addressee. It contains information that is legally privileged, confidential or otherwise protected from use or disclosure. If you are not the intended recipient, you are strictly prohibited from reviewing, disclosing, copying using or disseminating any of this information or taking any action in reliance on or regarding this information. If you have received this fax in error, please notify us immediately by telephone so that we can arrange for its return to us. Phone: [phone], Toll-Free: [phone], Fax: [phone] Page: 1 of 2 Call Id: [id number] [facility] Night - Client TELEPHONE ADVICE RECORD [facility] Patient Name: Madison Montoya Born Gender: Female DOB: Sep 01, 1932 Age: 81 Y 2 M 5 D 5 D Return Phone Number: Address: City/State/Zip: [place] Client [facility] Night - Client [facility] Night Physician Tillman AbideLetvak, Richard Contact Type Call Call Type Triage / Clinical Relationship To Patient Provider Return Phone Number Please choose phone number Chief Complaint CHEST PAIN (>=21 years) - pain, pressure, heaviness or tightness Initial Comment Caller sttaes [facility] is having chest painagain; has nothing to give her; [phone] PreDisposition InappropriateToAsk Nurse Assessment Nurse: Ladona RidgelGaddy, RN, Sunny SchleinFelicia Date/Time (Eastern Time): 05/08/2015 6:37:46 PM Confirm and document reason for call. If symptomatic, describe symptoms. ---[facility] [place]. She complained of chest pains 3x last night. They called EMS and the EMS will not take her in bc they ran a strip and she has A fib. THen EMS would not take them in. THe family wants something for her to have for anxiety. Ann Market researcherN BSN. MD  said to call 911 last night. Has the patient traveled out of the country within the last 30 days? ---No Does the patient require triage? ---Yes Related visit to physician within the last 2 weeks? ---No Does the PT have any chronic conditions? (i.e. diabetes, asthma, etc.) ---Yes List chronic conditions. ---dementia, afib, DM, HTN, CAD stent gerd Guidelines Guideline Title Affirmed Question Affirmed Notes Nurse Date/Time (Eastern Time) Chest Pain SEVERE difficulty breathing (e.g., struggling for each breath, speaks in single words) Gaddy, RN, Sunny SchleinFelicia 05/08/2015 6:42:00 PM Disp. Time Lamount Cohen(Eastern Time) Disposition Final User 05/08/2015 6:36:13 PM Send to Urgent Orlin HildingQueue Thibou, Jasmine 05/08/2015 6:49:09 PM Called On-Call Provider [place], RN, [facility] 05/08/2015 6:52:28 PM 911 Outcome Documentation Ladona RidgelGaddy, RN, Sunny SchleinFelicia Reason: EMS was reached 05/08/2015 6:52:39 PM Call Completed Ladona RidgelGaddy, RN, Sunny SchleinFelicia PLEASE NOTE: All timestamps contained within this report are represented as [place] Standard Time. CONFIDENTIALTY NOTICE: This fax transmission is intended only for the addressee. It contains information that is legally privileged, confidential or otherwise protected from use or disclosure. If you are not the intended recipient, you are strictly prohibited from reviewing, disclosing, copying using or disseminating any of this information or taking any action in reliance on or regarding this information. If you have received this fax in error, please notify us immediately by telephone so that we can arrange for its return to us. Phone: [phone], Toll-Free: [phone], Fax: [phone] Page: 2 of 2 Call Id: [id number] 05/08/2015 6:44:36 PM Call EMS 911 Now Yes Ladona RidgelGaddy, RN, Lavena StanfordFelicia Caller Understands: Yes Disagree/Comply: Comply Care Advice Given Per Guideline CALL EMS 911 NOW: Immediate medical attention is needed. You need to hang up and call 911 (or an ambulance). (Triager Discretion: I'll call you back  in a few minutes to  be sure you were able to reach them.) After Care Instructions Given Call Event Type User Date / Time Description Comments User: Felicia, Gaddy, RN Date/Time (Eastern Time): 05/08/2015 6:44:29 PM nurse has concerns bc family is pressuring her for anxiety med for pt. We agreed she would call 911 while nurse paged MD. Paging DoctorName Phone DateTime Result/Outcome Message Type Notes Kwiatkowski, Pete [phone] 05/08/2015 6:49:09 PM Called On Call Provider - Reached Doctor Paged Kwiatkowski, Pete 05/08/2015 6:51:33 PM Spoke with On Call - General Message Result tell her MD agrees if she has chest pain 42 RR and age and stent Call - unless family refuses and they have POA call 911. Gave caller MD response and she voiced understanding. 

## 2015-05-09 NOTE — Telephone Encounter (Signed)
PLEASE NOTE: All timestamps contained within this report are represented as [place] Standard Time. CONFIDENTIALTY NOTICE: This fax transmission is intended only for the addressee. It contains information that is legally privileged, confidential or otherwise protected from use or disclosure. If you are not the intended recipient, you are strictly prohibited from reviewing, disclosing, copying using or disseminating any of this information or taking any action in reliance on or regarding this information. If you have received this fax in error, please notify us immediately by telephone so that we can arrange for its return to us. Phone: [phone], Toll-Free: [phone], Fax: [phone] Page: 1 of 2 Call Id: [id number] [facility] Night - Client TELEPHONE ADVICE RECORD [facility] Patient Name: Madison Montoya Born Gender: Female DOB: Sep 01, 1932 Age: 81 Y 2 M 5 D Return Phone Number: Address: City/State/Zip: [place] Client [facility] Night - Client [facility] Night Physician Tillman AbideLetvak, Richard Contact Type Call Call Type Triage / Clinical Relationship To Patient Provider Return Phone Number Please choose phone number Chief Complaint CHEST PAIN (>=79 years) - pain, pressure, heaviness or tightness Initial Comment Caller sttaes [facility] patient is having chest painagain; has nothing to give her; [phone] PreDisposition InappropriateToAsk Nurse Assessment Nurse: Ladona RidgelGaddy, RN, Sunny SchleinFelicia Date/Time (Eastern Time): 05/08/2015 6:37:46 PM Confirm and document reason for call. If symptomatic, describe symptoms. ---[facility] [place] [facility] [place]. She complained of chest pains 3x last night. They called EMS and the EMS will not take her in bc they ran a strip and she has A fib. THen EMS would not take them in. THe family wants something for her to have for anxiety. Ann Market researcherN BSN. MD  said to call 911 last night. Has the patient traveled out of the country within the last 30 days? ---No Does the patient require triage? ---Yes Related visit to physician within the last 2 weeks? ---No Does the PT have any chronic conditions? (i.e. diabetes, asthma, etc.) ---Yes List chronic conditions. ---dementia, afib, DM, HTN, CAD stent gerd Guidelines Guideline Title Affirmed Question Affirmed Notes Nurse Date/Time (Eastern Time) Chest Pain SEVERE difficulty breathing (e.g., struggling for each breath, speaks in single words) Gaddy, RN, Sunny SchleinFelicia 05/08/2015 6:42:00 PM Disp. Time Lamount Cohen(Eastern Time) Disposition Final User 05/08/2015 6:36:13 PM Send to Urgent Orlin HildingQueue Thibou, Jasmine 05/08/2015 6:49:09 PM Called On-Call Provider [place], RN, [facility] [facility] 05/08/2015 6:52:28 PM 911 Outcome Documentation Ladona RidgelGaddy, RN, Sunny SchleinFelicia Reason: EMS was reached 05/08/2015 6:52:39 PM Call Completed Ladona RidgelGaddy, RN, Sunny SchleinFelicia PLEASE NOTE: All timestamps contained within this report are represented as [place] Standard Time. CONFIDENTIALTY NOTICE: This fax transmission is intended only for the addressee. It contains information that is legally privileged, confidential or otherwise protected from use or disclosure. If you are not the intended recipient, you are strictly prohibited from reviewing, disclosing, copying using or disseminating any of this information or taking any action in reliance on or regarding this information. If you have received this fax in error, please notify us immediately by telephone so that we can arrange for its return to us. Phone: [phone], Toll-Free: [phone], Fax: [phone] Page: 2 of 2 Call Id: [id number] 05/08/2015 6:44:36 PM Call EMS 911 Now Yes Ladona RidgelGaddy, RN, Lavena StanfordFelicia Caller Understands: Yes Disagree/Comply: Comply Care Advice Given Per Guideline CALL EMS 911 NOW: Immediate medical attention is needed. You need to hang up and call 911 (or an ambulance). (Triager Discretion: I'll call you back  in a few minutes to  be sure you were able to reach them.) After Care Instructions Given Call Event Type User Date / Time Description Comments User: Hampton Abbot, RN Date/Time Lamount Cohen Time): 05/08/2015 6:44:29 PM nurse has concerns bc family is pressuring her for anxiety med for pt. We agreed she would call 911 while nurse paged MD. Margy Clarks Phone DateTime Result/Outcome Message Type Notes [place] Harbor [phone] 05/08/2015 6:49:09 PM Called On Call Provider - Reached Doctor Paged [place] 05/08/2015 6:51:33 PM Spoke with On Call - General Message Result tell her MD agrees if she has chest pain 42 RR and age and stent Call - unless family refuses and they have POA call 911. Gave caller MD response and she voiced understanding.

## 2015-05-09 NOTE — Telephone Encounter (Signed)
I saw her today and spoke to family Increased meds for her severe anxiety

## 2015-05-24 DIAGNOSIS — R443 Hallucinations, unspecified: Secondary | ICD-10-CM

## 2015-05-24 DIAGNOSIS — F0150 Vascular dementia without behavioral disturbance: Secondary | ICD-10-CM

## 2015-05-24 DIAGNOSIS — I2510 Atherosclerotic heart disease of native coronary artery without angina pectoris: Secondary | ICD-10-CM

## 2015-05-24 DIAGNOSIS — G629 Polyneuropathy, unspecified: Secondary | ICD-10-CM

## 2015-05-24 DIAGNOSIS — F419 Anxiety disorder, unspecified: Secondary | ICD-10-CM

## 2015-05-24 DIAGNOSIS — E1165 Type 2 diabetes mellitus with hyperglycemia: Secondary | ICD-10-CM

## 2015-05-24 DIAGNOSIS — I4891 Unspecified atrial fibrillation: Secondary | ICD-10-CM

## 2015-06-03 DIAGNOSIS — R05 Cough: Secondary | ICD-10-CM | POA: Diagnosis not present

## 2015-06-14 ENCOUNTER — Telehealth: Payer: Self-pay

## 2015-06-14 DIAGNOSIS — M25561 Pain in right knee: Secondary | ICD-10-CM | POA: Diagnosis not present

## 2015-06-14 NOTE — Telephone Encounter (Signed)
PLEASE NOTE: All timestamps contained within this report are represented as [place] Standard Time. CONFIDENTIALTY NOTICE: This fax transmission is intended only for the addressee. It contains information that is legally privileged, confidential or otherwise protected from use or disclosure. If you are not the intended recipient, you are strictly prohibited from reviewing, disclosing, copying using or disseminating any of this information or taking any action in reliance on or regarding this information. If you have received this fax in error, please notify us immediately by telephone so that we can arrange for its return to [place]. Phone: [phone], Toll-Free: [phone], Fax: [phone] Page: 1 of 1 Call Id: [id number] [facility] Night - Client TELEPHONE ADVICE RECORD [facility] Patient Name: KARISTA AISPURO Gender: Female DOB: [birth year] Age: 74 Y 3 M 12 D Return Phone Number: Address: City/State/Zip: [place] [facility] Night - Client Client Site [place] [facility] [place] - Night Physician Tillman Abide Contact Type Call Call Type Page Only Caller Name Lanora Manis Relationship To Patient Other Is this call to report lab results? No Return Phone Number Please choose phone number Initial Comment [facility] [phone]) is calling regarding pt knee injury(swelling and bruising) after falling Nurse Assessment Guidelines Guideline Title Affirmed Question Affirmed Notes Nurse Date/Time (Eastern Time) Disp. Time Lamount Cohen Time) Disposition Final User 06/14/2015 12:55:27 AM Send to [facility] Paging Queue Leeroy Cha 06/14/2015 12:59:24 AM Paged On Call back to Call Center - PC Dorene Sorrow 06/14/2015 1:14:13 AM [facility] Provider Antonieta Iba 06/14/2015 1:15:29 AM Page Completed Yes Antonieta Iba After Care Instructions Given Call Event Type User Date / Time  Description Paging DoctorName Phone DateTime Result/Outcome Message Type Notes Rene Paci [phone] 06/14/2015 12:59:24 AM Paged On Call Back to Call Center Doctor Paged Please call [facility] @ [phone] Rene Paci [phone] 06/14/2015 1:14:13 AM Called On Call Provider - Reached Doctor Paged Rene Paci 06/14/2015 1:14:17 AM Spoke with On Call - General Message Result

## 2015-06-14 NOTE — Telephone Encounter (Signed)
Can have follow up by Rene Kocher who is there today

## 2015-06-18 DIAGNOSIS — F0151 Vascular dementia with behavioral disturbance: Secondary | ICD-10-CM

## 2015-06-18 DIAGNOSIS — I482 Chronic atrial fibrillation: Secondary | ICD-10-CM

## 2015-06-18 DIAGNOSIS — E1142 Type 2 diabetes mellitus with diabetic polyneuropathy: Secondary | ICD-10-CM

## 2015-06-18 DIAGNOSIS — M159 Polyosteoarthritis, unspecified: Secondary | ICD-10-CM

## 2015-06-18 DIAGNOSIS — F411 Generalized anxiety disorder: Secondary | ICD-10-CM

## 2015-06-21 DIAGNOSIS — M10061 Idiopathic gout, right knee: Secondary | ICD-10-CM | POA: Diagnosis not present

## 2015-07-08 ENCOUNTER — Telehealth: Payer: Self-pay

## 2015-07-08 NOTE — Telephone Encounter (Signed)
PLEASE NOTE: All timestamps contained within this report are represented as [place] Standard Time. CONFIDENTIALTY NOTICE: This fax transmission is intended only for the addressee. It contains information that is legally privileged, confidential or otherwise protected from use or disclosure. If you are not the intended recipient, you are strictly prohibited from reviewing, disclosing, copying using or disseminating any of this information or taking any action in reliance on or regarding this information. If you have received this fax in error, please notify us immediately by telephone so that we can arrange for its return to [place]. Phone: [phone], Toll-Free: [phone], Fax: [phone] Page: 1 of 1 Call Id: [id number] [place] [facility] Night - Client TELEPHONE ADVICE RECORD [facility] Medical Call Center Patient Name: Madison Montoya Gender: Female DOB: 1932-09-17 Age: 89 Y 4 M 3 D Return Phone Number: Address: City/State/Zip: [place] [facility] Night - Client Client Site  [facility] [place] - Night Physician [place] [facility] Contact Type Call Call Type Page Only Caller Name Dondra Spry Relationship To Patient Provider Is this call to report lab results? No Return Phone Number Please choose phone number Initial Comment Caller states she is Dondra Spry from [facility]. Patient has been having difficulty with her right knee and the NP saw her on August 12th and determined it might be gout. there is less swelling but the knee cap is sticking straight up and the femur isn't connecting muscle wise where the knee muscles should be. Daughter requested the mobile x-ray service to do an x-ray since it looks much different since Tuesday. CB# [phone] Nurse Assessment Guidelines Guideline Title Affirmed Question Affirmed Notes Nurse Date/Time Lamount Cohen Time) Disp. Time Lamount Cohen Time) Disposition Final User 07/06/2015 2:14:51 PM Send  to [facility] Paging Delmer Islam 07/06/2015 2:29:14 PM Paged On Call to Other Provider Ulice Dash 07/06/2015 2:29:32 PM Page Completed Yes Ulice Dash After Care Instructions Given Call Event Type User Date / Time Description Paging DoctorName Phone DateTime Result/Outcome Message Type Notes Raechel Ache [phone] 07/06/2015 2:29:14 PM Paged On Call to Other Provider Doctor Paged Raechel Ache 07/06/2015 2:29:22 PM Spoke with On Call - General Message Result connected with facility

## 2015-07-08 NOTE — Telephone Encounter (Signed)
X-ray done and was negative Asked staff to inform daughter

## 2015-07-09 ENCOUNTER — Inpatient Hospital Stay
Admission: EM | Admit: 2015-07-09 | Discharge: 2015-07-12 | DRG: 871 | Disposition: A | Discharge status: Hospice | Payer: Medicare Other | Attending: Internal Medicine | Admitting: Internal Medicine

## 2015-07-09 ENCOUNTER — Emergency Department: Payer: Medicare Other

## 2015-07-09 ENCOUNTER — Encounter: Payer: Self-pay | Admitting: Internal Medicine

## 2015-07-09 DIAGNOSIS — R6510 Systemic inflammatory response syndrome (SIRS) of non-infectious origin without acute organ dysfunction: Secondary | ICD-10-CM

## 2015-07-09 DIAGNOSIS — N390 Urinary tract infection, site not specified: Secondary | ICD-10-CM | POA: Diagnosis present

## 2015-07-09 DIAGNOSIS — I5033 Acute on chronic diastolic (congestive) heart failure: Secondary | ICD-10-CM | POA: Diagnosis present

## 2015-07-09 DIAGNOSIS — I482 Chronic atrial fibrillation: Secondary | ICD-10-CM | POA: Diagnosis present

## 2015-07-09 DIAGNOSIS — E43 Unspecified severe protein-calorie malnutrition: Secondary | ICD-10-CM | POA: Diagnosis present

## 2015-07-09 DIAGNOSIS — E119 Type 2 diabetes mellitus without complications: Secondary | ICD-10-CM | POA: Diagnosis present

## 2015-07-09 DIAGNOSIS — I10 Essential (primary) hypertension: Secondary | ICD-10-CM | POA: Diagnosis present

## 2015-07-09 DIAGNOSIS — F419 Anxiety disorder, unspecified: Secondary | ICD-10-CM | POA: Diagnosis present

## 2015-07-09 DIAGNOSIS — M1990 Unspecified osteoarthritis, unspecified site: Secondary | ICD-10-CM | POA: Diagnosis present

## 2015-07-09 DIAGNOSIS — Z515 Encounter for palliative care: Secondary | ICD-10-CM

## 2015-07-09 DIAGNOSIS — R0602 Shortness of breath: Secondary | ICD-10-CM

## 2015-07-09 DIAGNOSIS — G9349 Other encephalopathy: Secondary | ICD-10-CM | POA: Diagnosis present

## 2015-07-09 DIAGNOSIS — A419 Sepsis, unspecified organism: Principal | ICD-10-CM | POA: Diagnosis present

## 2015-07-09 DIAGNOSIS — S76101A Unspecified injury of right quadriceps muscle, fascia and tendon, initial encounter: Secondary | ICD-10-CM

## 2015-07-09 DIAGNOSIS — F0390 Unspecified dementia without behavioral disturbance: Secondary | ICD-10-CM | POA: Diagnosis present

## 2015-07-09 DIAGNOSIS — E559 Vitamin D deficiency, unspecified: Secondary | ICD-10-CM | POA: Diagnosis present

## 2015-07-09 DIAGNOSIS — D649 Anemia, unspecified: Secondary | ICD-10-CM | POA: Diagnosis present

## 2015-07-09 DIAGNOSIS — I509 Heart failure, unspecified: Secondary | ICD-10-CM | POA: Diagnosis not present

## 2015-07-09 DIAGNOSIS — K5900 Constipation, unspecified: Secondary | ICD-10-CM | POA: Diagnosis present

## 2015-07-09 DIAGNOSIS — Z7982 Long term (current) use of aspirin: Secondary | ICD-10-CM | POA: Diagnosis not present

## 2015-07-09 DIAGNOSIS — Z66 Do not resuscitate: Secondary | ICD-10-CM | POA: Diagnosis present

## 2015-07-09 HISTORY — DX: Chronic atrial fibrillation, unspecified: I48.20

## 2015-07-09 LAB — LACTIC ACID, PLASMA
Lactic Acid, Venous: 1.6 mmol/L (ref 0.5–2.0)
Lactic Acid, Venous: 1.7 mmol/L (ref 0.5–2.0)

## 2015-07-09 LAB — URINALYSIS COMPLETE WITH MICROSCOPIC (ARMC ONLY)
Bilirubin Urine: NEGATIVE
Hgb urine dipstick: NEGATIVE
KETONES UR: NEGATIVE mg/dL
Leukocytes, UA: NEGATIVE
NITRITE: NEGATIVE
Protein, ur: NEGATIVE mg/dL
SPECIFIC GRAVITY, URINE: 1.021 (ref 1.005–1.030)
pH: 5 (ref 5.0–8.0)

## 2015-07-09 LAB — COMPREHENSIVE METABOLIC PANEL
ALBUMIN: 2.8 g/dL — AB (ref 3.5–5.0)
ALK PHOS: 94 U/L (ref 38–126)
ALT: 17 U/L (ref 14–54)
AST: 26 U/L (ref 15–41)
Anion gap: 9 (ref 5–15)
BILIRUBIN TOTAL: 0.8 mg/dL (ref 0.3–1.2)
BUN: 37 mg/dL — AB (ref 6–20)
CALCIUM: 9.1 mg/dL (ref 8.9–10.3)
CO2: 23 mmol/L (ref 22–32)
CREATININE: 1.03 mg/dL — AB (ref 0.44–1.00)
Chloride: 105 mmol/L (ref 101–111)
GFR calc Af Amer: 57 mL/min — ABNORMAL LOW (ref >60)
GFR calc non Af Amer: 50 mL/min — ABNORMAL LOW (ref >60)
GLUCOSE: 202 mg/dL — AB (ref 65–99)
Potassium: 3.8 mmol/L (ref 3.5–5.1)
Sodium: 137 mmol/L (ref 135–145)
TOTAL PROTEIN: 6.1 g/dL — AB (ref 6.5–8.1)

## 2015-07-09 LAB — BLOOD GAS, VENOUS
Acid-base deficit: 4.1 mmol/L — ABNORMAL HIGH (ref 0.0–2.0)
BICARBONATE: 22.2 meq/L (ref 21.0–28.0)
PATIENT TEMPERATURE: 37
PCO2 VEN: 44 mmHg (ref 44.0–60.0)
pH, Ven: 7.31 — ABNORMAL LOW (ref 7.320–7.430)

## 2015-07-09 LAB — TROPONIN I: Troponin I: 0.1 ng/mL — ABNORMAL HIGH (ref <0.031)

## 2015-07-09 LAB — CBC WITH DIFFERENTIAL/PLATELET
BASOS ABS: 0.2 10*3/uL — AB (ref 0–0.1)
BASOS PCT: 1 %
EOS ABS: 0 10*3/uL (ref 0–0.7)
EOS PCT: 0 %
HCT: 27.7 % — ABNORMAL LOW (ref 35.0–47.0)
Hemoglobin: 8.4 g/dL — ABNORMAL LOW (ref 12.0–16.0)
Lymphocytes Relative: 3 %
Lymphs Abs: 0.8 10*3/uL — ABNORMAL LOW (ref 1.0–3.6)
MCH: 24.1 pg — ABNORMAL LOW (ref 26.0–34.0)
MCHC: 30.2 g/dL — ABNORMAL LOW (ref 32.0–36.0)
MCV: 79.7 fL — ABNORMAL LOW (ref 80.0–100.0)
MONO ABS: 1.2 10*3/uL — AB (ref 0.2–0.9)
Monocytes Relative: 4 %
Neutro Abs: 27 10*3/uL — ABNORMAL HIGH (ref 1.4–6.5)
Neutrophils Relative %: 92 %
Platelets: 513 10*3/uL — ABNORMAL HIGH (ref 150–440)
RBC: 3.48 MIL/uL — ABNORMAL LOW (ref 3.80–5.20)
RDW: 24.3 % — AB (ref 11.5–14.5)
WBC: 29.2 10*3/uL — ABNORMAL HIGH (ref 3.6–11.0)

## 2015-07-09 LAB — PROTIME-INR
INR: 1.09
PROTHROMBIN TIME: 14.3 s (ref 11.4–15.0)

## 2015-07-09 LAB — LIPASE, BLOOD: LIPASE: 18 U/L — AB (ref 22–51)

## 2015-07-09 LAB — APTT: APTT: 28 s (ref 24–36)

## 2015-07-09 MED ORDER — ONDANSETRON HCL 4 MG/2ML IJ SOLN: 4.0000 mg | Freq: Four times a day (QID) | INTRAMUSCULAR | Status: DC | PRN

## 2015-07-09 MED ORDER — ONDANSETRON HCL 4 MG/2ML IJ SOLN
4.0000 mg | Freq: Once | INTRAMUSCULAR | Status: AC
  Administered 2015-07-09: 4 mg via INTRAVENOUS

## 2015-07-09 MED ORDER — SODIUM CHLORIDE 0.9 % IJ SOLN
3.0000 mL | Freq: Two times a day (BID) | INTRAMUSCULAR | Status: DC
  Administered 2015-07-10 – 2015-07-12 (×3): 3 mL via INTRAVENOUS

## 2015-07-09 MED ORDER — ASPIRIN 300 MG RE SUPP
300.0000 mg | Freq: Once | RECTAL | Status: AC
  Administered 2015-07-09: 300 mg via RECTAL
  Filled 2015-07-09: qty 1

## 2015-07-09 MED ORDER — ONDANSETRON HCL 4 MG/2ML IJ SOLN
INTRAMUSCULAR | Status: AC
  Administered 2015-07-09: 4 mg via INTRAVENOUS
  Filled 2015-07-09: qty 2

## 2015-07-09 MED ORDER — VANCOMYCIN HCL IN DEXTROSE 1-5 GM/200ML-% IV SOLN
1000.0000 mg | Freq: Once | INTRAVENOUS | Status: AC
  Administered 2015-07-09: 1000 mg via INTRAVENOUS
  Filled 2015-07-09: qty 200

## 2015-07-09 MED ORDER — SODIUM CHLORIDE 0.9 % IV SOLN
INTRAVENOUS | Status: DC
  Administered 2015-07-10 – 2015-07-11 (×3): via INTRAVENOUS

## 2015-07-09 MED ORDER — ONDANSETRON HCL 4 MG PO TABS: 4.0000 mg | ORAL_TABLET | Freq: Four times a day (QID) | ORAL | Status: DC | PRN

## 2015-07-09 MED ORDER — PIPERACILLIN-TAZOBACTAM 3.375 G IVPB 30 MIN
3.3750 g | Freq: Once | INTRAVENOUS | Status: AC
  Administered 2015-07-09: 3.375 g via INTRAVENOUS
  Filled 2015-07-09: qty 50

## 2015-07-09 MED ORDER — ACETAMINOPHEN 650 MG RE SUPP
650.0000 mg | Freq: Four times a day (QID) | RECTAL | Status: DC | PRN
  Administered 2015-07-11: 650 mg via RECTAL
  Filled 2015-07-09: qty 1

## 2015-07-09 MED ORDER — HEPARIN SODIUM (PORCINE) 5000 UNIT/ML IJ SOLN
5000.0000 [IU] | Freq: Three times a day (TID) | INTRAMUSCULAR | Status: DC
  Administered 2015-07-10 (×3): 5000 [IU] via SUBCUTANEOUS
  Filled 2015-07-09 (×3): qty 1

## 2015-07-09 MED ORDER — IPRATROPIUM-ALBUTEROL 0.5-2.5 (3) MG/3ML IN SOLN
3.0000 mL | RESPIRATORY_TRACT | Status: DC | PRN
  Administered 2015-07-10: 3 mL via RESPIRATORY_TRACT
  Filled 2015-07-09: qty 3

## 2015-07-09 MED ORDER — ACETAMINOPHEN 325 MG PO TABS: 650.0000 mg | ORAL_TABLET | Freq: Four times a day (QID) | ORAL | Status: DC | PRN

## 2015-07-09 MED ORDER — SODIUM CHLORIDE 0.9 % IV BOLUS (SEPSIS)
1770.0000 mL | Freq: Once | INTRAVENOUS | Status: AC
  Administered 2015-07-09: 1770 mL via INTRAVENOUS

## 2015-07-09 NOTE — ED Provider Notes (Signed)
[facility] Emergency Department Provider Note  ____________________________________________  Time seen: Approximately 8:03 PM  I have reviewed the triage vital signs and the nursing notes.   HISTORY  Chief Complaint Shortness of Breath  Caveat-history of present illness and review of systems Limited secondary to the patient's dementia. Information obtained from EMS on arrival.   HPI Madison Montoya is a 81 y.o. female with hypertension, diabetes, atrial fibrillation, dementia who presents From [facility] [place] for altered mental status and breathing difficulty. Per EMS, they were called out to the facility because staff at the facility felt that the patient was more altered than usual, had declining level of consciousness and was hypoxic. On EMS arrival her O2 saturation was 98% however she did appear altered.the patient denies any pain complaints, no shortness of breath.    Past Medical History  Diagnosis Date  . Hypertension   . Diabetes mellitus without complication   . Dementia   . Arthritis   . History of echocardiogram     a. echo 03/2015: EF 55-60%, LA/RA mildly dilated, no AI    Patient Active Problem List   Diagnosis Date Noted  . Sepsis 07/09/2015  . Blood loss anemia 03/19/2015  . Acute lower GI bleeding 03/19/2015  . Persistent atrial fibrillation   . A-fib 03/16/2015  . UTI (lower urinary tract infection) 03/16/2015  . Leukocytosis 03/16/2015  . HTN (hypertension) 03/16/2015  . DM (diabetes mellitus) 03/16/2015    Past Surgical History  Procedure Laterality Date  . Fracture surgery    . Cardiac catheterization      [facility]    Current Outpatient Rx  Name  Route  Sig  Dispense  Refill  . acetaminophen (TYLENOL) 500 MG tablet   Oral   Take 500-1,000 mg by mouth 2 (two) times daily as needed for mild pain.          Marland Kitchen acetaminophen (TYLENOL) 650 MG CR tablet   Oral   Take 650 mg by mouth 3 (three) times daily as needed for pain.          Marland Kitchen aspirin EC 81 MG tablet   Oral   Take 81 mg by mouth daily.          Marland Kitchen diltiazem (CARDIZEM CD) 120 MG 24 hr capsule   Oral   Take 1 capsule (120 mg total) by mouth at bedtime.   30 capsule   0   . DULoxetine (CYMBALTA) 30 MG capsule   Oral   Take 30 mg by mouth 2 (two) times daily.         . feeding supplement, ENSURE ENLIVE, (ENSURE ENLIVE) LIQD   Oral   Take 237 mLs by mouth 3 (three) times daily with meals. Patient taking differently: Take 237 mLs by mouth 2 (two) times daily between meals.    237 mL   12   . ferrous sulfate 325 (65 FE) MG tablet   Oral   Take 325 mg by mouth daily.         Marland Kitchen LORazepam (ATIVAN) 0.5 MG tablet   Oral   Take 0.5 mg by mouth 3 (three) times daily as needed for anxiety.         . memantine (NAMENDA) 10 MG tablet   Oral   Take 10 mg by mouth 2 (two) times daily.         . metoprolol tartrate (LOPRESSOR) 25 MG tablet   Oral   Take 25 mg by mouth 2 (  two) times daily.         . polyethylene glycol (MIRALAX / GLYCOLAX) packet   Oral   Take 17 g by mouth every evening.         Marland Kitchen PRESCRIPTION MEDICATION   Topical   Apply 1 mL topically 3 (three) times daily as needed (for anxiety). Pt uses Lorazepam 0.5mg /mL topically.         . risperiDONE (RISPERDAL) 1 MG tablet   Oral   Take 1 mg by mouth 2 (two) times daily.         Marland Kitchen senna-docusate (SENOKOT-S) 8.6-50 MG per tablet   Oral   Take 2 tablets by mouth daily.         . Vitamin D, Ergocalciferol, (DRISDOL) 50000 UNITS CAPS capsule   Oral   Take 50,000 Units by mouth every 30 (thirty) days. Pt takes on the 17th of every month.         Marland Kitchen LORazepam (ATIVAN) 1 MG tablet   Oral   Take 1 tablet (1 mg total) by mouth every 8 (eight) hours as needed for anxiety. Patient not taking: Reported on 07/09/2015   60 tablet   0     Allergies Sulfa antibiotics; Benzodiazepines; and Oxycodone-acetaminophen  Family History  Problem Relation Age of Onset  . Diabetes  Neg Hx     Social History Social History  Substance Use Topics  . Smoking status: Never Smoker   . Smokeless tobacco: None  . Alcohol Use: No    Review of Systems  Caveat-history of present illness and review of systems Limited secondary to the patient's dementia. Information obtained from EMS on arrival.  ____________________________________________   PHYSICAL EXAM: Filed Vitals:   07/09/15 2030 07/09/15 2045 07/09/15 2049 07/09/15 2100  BP: 119/65  119/65 116/60  Pulse:   102   Temp:   100.1 F (37.8 C)   TempSrc:   Rectal   Resp: 18 17 17 15   Height:      Weight:      SpO2:   95%       Constitutional: eyes open spontaneously and she follows commands and answers questions appropriately but she appears fatigued. She appears dehydrated and sluggish. Eyes: Conjunctivae are normal. PERRL. EOMI. Head: Atraumatic. Nose: No congestion/rhinnorhea. Mouth/Throat: Mucous membranes are dry.  Oropharynx non-erythematous. Neck: No stridor. Supple without meningismus. Cardiovascular: tachycardic rate, irregular rhythm. Grossly normal heart sounds.  Good peripheral circulation. Respiratory:mild tachypnea. Normal respiratory effort.  No retractions. Lungs CTAB. Gastrointestinal: Soft and nontender. No distention. No abdominal bruits. No CVA tenderness. Genitourinary: deferred Musculoskeletal: No lower extremity tenderness nor edema.  No joint effusions. Neurologic: moves all extremities equally and to command but does not cooperate with formal neurological testing. Skin:  Skin is warm, dry and intact. No rash noted. Psychiatric: unable to assess  ____________________________________________   LABS (all labs ordered are listed, but only abnormal results are displayed)  Labs Reviewed  COMPREHENSIVE METABOLIC PANEL - Abnormal; Notable for the following:    Glucose, Bld 202 (*)    BUN 37 (*)    Creatinine, Ser 1.03 (*)    Total Protein 6.1 (*)    Albumin 2.8 (*)    GFR calc  non Af Amer 50 (*)    GFR calc Af Amer 57 (*)    All other components within normal limits  CBC WITH DIFFERENTIAL/PLATELET - Abnormal; Notable for the following:    WBC 29.2 (*)    RBC 3.48 (*)  Hemoglobin 8.4 (*)    HCT 27.7 (*)    MCV 79.7 (*)    MCH 24.1 (*)    MCHC 30.2 (*)    RDW 24.3 (*)    Platelets 513 (*)    Neutro Abs 27.0 (*)    Lymphs Abs 0.8 (*)    Monocytes Absolute 1.2 (*)    Basophils Absolute 0.2 (*)    All other components within normal limits  TROPONIN I - Abnormal; Notable for the following:    Troponin I 0.10 (*)    All other components within normal limits  LIPASE, BLOOD - Abnormal; Notable for the following:    Lipase 18 (*)    All other components within normal limits  URINALYSIS COMPLETEWITH MICROSCOPIC ([facility] ONLY) - Abnormal; Notable for the following:    Color, Urine YELLOW (*)    APPearance HAZY (*)    Glucose, UA >500 (*)    Bacteria, UA MANY (*)    Squamous Epithelial / LPF 0-5 (*)    All other components within normal limits  BLOOD GAS, VENOUS - Abnormal; Notable for the following:    pH, Ven 7.31 (*)    Acid-base deficit 4.1 (*)    All other components within normal limits  CULTURE, BLOOD (ROUTINE X 2)  CULTURE, BLOOD (ROUTINE X 2)  URINE CULTURE  APTT  PROTIME-INR  LACTIC ACID, PLASMA  LACTIC ACID, PLASMA  CBC  CREATININE, SERUM  BASIC METABOLIC PANEL  CBC   ____________________________________________  EKG  ED ECG REPORT I, Gayla Doss, the attending physician, personally viewed and interpreted this ECG.   Date: 07/09/2015  EKG Time: 20:06  Rate: 115  Rhythm: atrial fibrillation, rate 115  Axis: normal  Intervals:none  ST&T Change: no acute ST elevation.  ____________________________________________  RADIOLOGY  CXR IMPRESSION: Peribronchial thickening noted. Mild bibasilar atelectasis seen.    CT head  IMPRESSION: Chronic findings as above, no acute intracranial  abnormality.   ____________________________________________   PROCEDURES  Procedure(s) performed: None  Critical Care performed: Yes, see critical care note(s). Total critical care time spent 30 minutes.  ____________________________________________   INITIAL IMPRESSION / ASSESSMENT AND PLAN / ED COURSE  Pertinent labs & imaging results that were available during my care of the patient were reviewed by me and considered in my medical decision making (see chart for details).  Madison Montoya is a 81 y.o. female with hypertension, diabetes, atrial fibrillation, dementia who presents From [facility] [place] for altered mental status and breathing difficulty. On exam she is tachycardic, mildly tachypneic borderline febrile with temperature 100.1 rectally. She appears profoundly dehydrated. She is meeting 2 out of 4 Sirs criteria. We'll obtain labs, EKG, troponin, chest x-ray, urinalysis, and give liberal IV fluids as well as vancomycin and Zosyn  due to concern for sepsis. Anticipate admission.  ----------------------------------------- 10:58 PM on 07/09/2015 -----------------------------------------  Tachycardia improved significantly with the above treatment.labs notable for severe leukocytosis at 29,000. Mild chronic anemia with hemoglobin 8.4.troponin elevated at 0.10, rectal aspirin ordered.urinalysis with many bacteria however only 0-5 white blood cells. Chest x-ray shows no acute pneumonia. CT head negative for any acute intracranial process. Neck is supple without meningismus, doubt meningitis. Discussed with hospitalist for admission. ____________________________________________   FINAL CLINICAL IMPRESSION(S) / ED DIAGNOSES  Final diagnoses:  SIRS (systemic inflammatory response syndrome)      Gayla Doss, MD 07/10/15 0000

## 2015-07-09 NOTE — ED Notes (Signed)
Sent tol ED from [facility] for complaints of Shortness of Breath.  Patient denies any complaints currently. Skin warm to touch. Respirations regular and non labored.

## 2015-07-09 NOTE — H&P (Signed)
[facility] [place] [facility]   PATIENT NAME: Madison Montoya    MR#:  [id number]  DATE OF BIRTH:  1932-08-16   DATE OF ADMISSION:  07/09/2015  PRIMARY CARE PHYSICIAN: [facility]   REQUESTING/REFERRING PHYSICIAN: Inocencio Homes  CHIEF COMPLAINT:   Chief Complaint  Patient presents with  . Shortness of Breath    HISTORY OF PRESENT ILLNESS:  Madison Montoya  is a 81 y.o. female with a known history of chronic atrial fibrillation, type 2 diabetes uncomplicated, dementia presenting with shortness of breath. She is unable to provide any meaningful information given mental status at baseline however she apparently stated that she was short of breath at her nursing facility and sent to Hospital further workup and evaluation. Once again she is unable to provide meaningful information given mental status/medical condition. Emergency department course: Found to have markedly leukocytosis as well as be tachycardic concern for sepsis, code sepsis initiated  PAST MEDICAL HISTORY:   Past Medical History  Diagnosis Date  . Hypertension   . Diabetes mellitus without complication   . Dementia   . Arthritis   . History of echocardiogram     a. echo 03/2015: EF 55-60%, LA/RA mildly dilated, no AI    PAST SURGICAL HISTORY:   Past Surgical History  Procedure Laterality Date  . Fracture surgery    . Cardiac catheterization      [facility]    SOCIAL HISTORY:   Social History  Substance Use Topics  . Smoking status: Never Smoker   . Smokeless tobacco: Not on file  . Alcohol Use: No    FAMILY HISTORY:   Family History  Problem Relation Age of Onset  . Diabetes Neg Hx     DRUG ALLERGIES:   Allergies  Allergen Reactions  . Sulfa Antibiotics Anaphylaxis  . Benzodiazepines Other (See Comments)    Reaction:  Unknown   . Oxycodone-Acetaminophen Other (See Comments)    Reaction:  Unknown     REVIEW OF SYSTEMS:  Unable to obtain given patient's  mental status/medical condition   MEDICATIONS AT HOME:   Prior to Admission medications   Medication Sig Start Date End Date Taking? Authorizing Provider  acetaminophen (TYLENOL) 500 MG tablet Take 500-1,000 mg by mouth 2 (two) times daily as needed for mild pain.    Yes Historical Provider, MD  acetaminophen (TYLENOL) 650 MG CR tablet Take 650 mg by mouth 3 (three) times daily as needed for pain.   Yes Historical Provider, MD  aspirin EC 81 MG tablet Take 81 mg by mouth daily.    Yes Historical Provider, MD  diltiazem (CARDIZEM CD) 120 MG 24 hr capsule Take 1 capsule (120 mg total) by mouth at bedtime. 03/25/15  Yes Enedina Finner, MD  DULoxetine (CYMBALTA) 30 MG capsule Take 30 mg by mouth 2 (two) times daily.   Yes Historical Provider, MD  feeding supplement, ENSURE ENLIVE, (ENSURE ENLIVE) LIQD Take 237 mLs by mouth 3 (three) times daily with meals. Patient taking differently: Take 237 mLs by mouth 2 (two) times daily between meals.  03/25/15  Yes Enedina Finner, MD  ferrous sulfate 325 (65 FE) MG tablet Take 325 mg by mouth daily.   Yes Historical Provider, MD  LORazepam (ATIVAN) 0.5 MG tablet Take 0.5 mg by mouth 3 (three) times daily as needed for anxiety.   Yes Historical Provider, MD  memantine (NAMENDA) 10 MG tablet Take 10 mg by mouth 2 (two) times daily.   Yes Historical  Provider, MD  metoprolol tartrate (LOPRESSOR) 25 MG tablet Take 25 mg by mouth 2 (two) times daily.   Yes Historical Provider, MD  polyethylene glycol (MIRALAX / GLYCOLAX) packet Take 17 g by mouth every evening.   Yes Historical Provider, MD  PRESCRIPTION MEDICATION Apply 1 mL topically 3 (three) times daily as needed (for anxiety). Pt uses Lorazepam 0.5mg /mL topically.   Yes Historical Provider, MD  risperiDONE (RISPERDAL) 1 MG tablet Take 1 mg by mouth 2 (two) times daily.   Yes Historical Provider, MD  senna-docusate (SENOKOT-S) 8.6-50 MG per tablet Take 2 tablets by mouth daily.   Yes Historical Provider, MD  Vitamin D,  Ergocalciferol, (DRISDOL) 50000 UNITS CAPS capsule Take 50,000 Units by mouth every 30 (thirty) days. Pt takes on the 17th of every month.   Yes Historical Provider, MD  LORazepam (ATIVAN) 1 MG tablet Take 1 tablet (1 mg total) by mouth every 8 (eight) hours as needed for anxiety. Patient not taking: Reported on 07/09/2015 05/08/15 05/07/16  Emily Filbert, MD      VITAL SIGNS:  Blood pressure 116/60, pulse 102, temperature 100.1 F (37.8 C), temperature source Rectal, resp. rate 15, height 5\' 3"  (1.6 m), weight 115 lb (52.164 kg), SpO2 95 %.  PHYSICAL EXAMINATION:  VITAL SIGNS: Filed Vitals:   07/09/15 2100  BP: 116/60  Pulse:   Temp:   Resp: 15   GENERAL:81 y.o.female currently in mild acute distress given mental status.  HEAD: Normocephalic, atraumatic.  EYES: Pupils equal, round, reactive to light. Extraocular muscles intact. No scleral icterus.  MOUTH: Moist mucosal membrane. Dentition poor. No abscess noted.  EAR, NOSE, THROAT: Clear without exudates. No external lesions.  NECK: Supple. No thyromegaly. No nodules. No JVD.  PULMONARY: Diminished breath sounds throughout secondary to effort Clear to ascultation, without wheeze rails or rhonci. No use of accessory muscles, poor respiratory effort. Poor air entry bilaterally CHEST: Nontender to palpation.  CARDIOVASCULAR: S1 and S2. Irregular rate and irregular rhythm. No murmurs, rubs, or gallops. No edema. Pedal pulses 2+ bilaterally.  GASTROINTESTINAL: Soft, nontender, nondistended. No masses. Positive bowel sounds. No hepatosplenomegaly.  MUSCULOSKELETAL: No swelling, clubbing, or edema. Range of motion full in all extremities.  NEUROLOGIC: Difficult to fully assess given patient's inability to follow commands Cranial nerves II through XII are intact. No gross focal neurological deficits. Sensation intact. Reflexes intact.  SKIN: No ulceration, lesions, rashes, or cyanosis. Skin warm and dry. Turgor intact.  PSYCHIATRIC: Mood,  affect flattened. The patient is awake,  oriented to self, one has questioned she states " I don't know". Insight, judgment poor.    LABORATORY PANEL:   CBC  Recent Labs Lab 07/09/15 2005  WBC 29.2*  HGB 8.4*  HCT 27.7*  PLT 513*   ------------------------------------------------------------------------------------------------------------------  Chemistries   Recent Labs Lab 07/09/15 2005  NA 137  K 3.8  CL 105  CO2 23  GLUCOSE 202*  BUN 37*  CREATININE 1.03*  CALCIUM 9.1  AST 26  ALT 17  ALKPHOS 94  BILITOT 0.8   ------------------------------------------------------------------------------------------------------------------  Cardiac Enzymes  Recent Labs Lab 07/09/15 2005  TROPONINI 0.10*   ------------------------------------------------------------------------------------------------------------------  RADIOLOGY:  Ct Head Wo Contrast  07/09/2015   CLINICAL DATA:  Altered mental status  EXAM: CT HEAD WITHOUT CONTRAST  TECHNIQUE: Contiguous axial images were obtained from the base of the skull through the vertex without intravenous contrast.  COMPARISON:  None.  FINDINGS: Mild cortical volume loss noted with proportional ventricular prominence. Areas of periventricular white matter  hypodensity are most compatible with small vessel ischemic change. No acute hemorrhage, infarct, or mass lesion is identified. CSF density bilateral remote basal ganglial lacunar infarcts are noted. No skull fracture. Orbits and paranasal sinuses are unremarkable.  IMPRESSION: Chronic findings as above, no acute intracranial abnormality.   Electronically Signed   By: Christiana Pellant M.D.   On: 07/09/2015 22:51   Dg Chest Port 1 View  07/09/2015   CLINICAL DATA:  Acute onset of sepsis. Shortness of breath. Initial encounter.  EXAM: PORTABLE CHEST - 1 VIEW  COMPARISON:  Chest radiograph performed 05/08/2015  FINDINGS: The lungs are well-aerated. Peribronchial thickening is noted. Mild  bibasilar atelectasis is noted. There is no evidence of pleural effusion or pneumothorax.  The cardiomediastinal silhouette is borderline normal in size. No acute osseous abnormalities are seen.  IMPRESSION: Peribronchial thickening noted.  Mild bibasilar atelectasis seen.   Electronically Signed   By: Roanna Raider M.D.   On: 07/09/2015 20:41    EKG:   Orders placed or performed during the hospital encounter of 07/09/15  . ED EKG 12-Lead  . ED EKG 12-Lead    IMPRESSION AND PLAN:   81 year old Caucasian female history of hypertension essential, type 2 diabetes uncomplicated, dementia presenting with shortness of breath.  1. Sepsis, meeting septic criteria by tachycardia, leukocytosis present on arrival. Source unclear at this time Code sepsis initiated. Panculture. Broad-spectrum antibiotics including vancomycin/Zosyn and taper antibiotics when culture data returns. He has received a 30 mL/kg IV fluid bolus. Continue IV fluid hydration to keep mean arterial pressure greater than 65. He may require pressor therapy if blood pressure worsens. We will repeat lactic acid given the initial is greater than 2.2.  2. Atrial fibrillation rapid ventricular response: Heart rate has improved in emergency department with initiation of IV fluids, goal heart rate less than 120 continue with Cardizem 3. Essential hypertension: Lopressor 4. Elevated troponin: Place on telemetry, trend cardiac enzymes continue aspirin 5. Venous thromboembolism prophylactic: Heparin subcutaneous     All the records are reviewed and case discussed with ED provider. Management plans discussed with the patient, family and they are in agreement.  CODE STATUS: Full  TOTAL TIME TAKING CARE OF THIS PATIENT: 35 minutes.    Hower,  Mardi Mainland.D on 07/09/2015 at 11:49 PM  Between 7am to 6pm - Pager - [phone]  After 6pm: House Pager: - [phone]  [facility]  Office  [phone]  CC: Primary care  physician; [facility]

## 2015-07-10 ENCOUNTER — Inpatient Hospital Stay: Payer: Medicare Other

## 2015-07-10 ENCOUNTER — Telehealth: Payer: Self-pay

## 2015-07-10 DIAGNOSIS — E43 Unspecified severe protein-calorie malnutrition: Secondary | ICD-10-CM | POA: Diagnosis present

## 2015-07-10 LAB — BLOOD GAS, ARTERIAL
ACID-BASE DEFICIT: 3.8 mmol/L — AB (ref 0.0–2.0)
ALLENS TEST (PASS/FAIL): POSITIVE — AB
Bicarbonate: 19 mEq/L — ABNORMAL LOW (ref 21.0–28.0)
FIO2: 0.28
O2 Saturation: 98.2 %
PCO2 ART: 28 mmHg — AB (ref 32.0–48.0)
PH ART: 7.44 (ref 7.350–7.450)
Patient temperature: 37
pO2, Arterial: 105 mmHg (ref 83.0–108.0)

## 2015-07-10 LAB — BASIC METABOLIC PANEL
ANION GAP: 9 (ref 5–15)
Anion gap: 4 — ABNORMAL LOW (ref 5–15)
BUN: 34 mg/dL — AB (ref 6–20)
BUN: 29 mg/dL — AB (ref 6–20)
CALCIUM: 8.1 mg/dL — AB (ref 8.9–10.3)
CHLORIDE: 109 mmol/L (ref 101–111)
CHLORIDE: 111 mmol/L (ref 101–111)
CO2: 23 mmol/L (ref 22–32)
CO2: 17 mmol/L — ABNORMAL LOW (ref 22–32)
CREATININE: 0.98 mg/dL (ref 0.44–1.00)
Calcium: 8.5 mg/dL — ABNORMAL LOW (ref 8.9–10.3)
Creatinine, Ser: 0.92 mg/dL (ref 0.44–1.00)
GFR calc Af Amer: >60 mL/min (ref >60)
GFR calc non Af Amer: 53 mL/min — ABNORMAL LOW (ref >60)
GFR calc non Af Amer: 57 mL/min — ABNORMAL LOW (ref >60)
GLUCOSE: 200 mg/dL — AB (ref 65–99)
Glucose, Bld: 208 mg/dL — ABNORMAL HIGH (ref 65–99)
POTASSIUM: 4.1 mmol/L (ref 3.5–5.1)
Potassium: 3.7 mmol/L (ref 3.5–5.1)
SODIUM: 136 mmol/L (ref 135–145)
Sodium: 137 mmol/L (ref 135–145)

## 2015-07-10 LAB — PREPARE RBC (CROSSMATCH)

## 2015-07-10 LAB — CBC
HCT: 23.6 % — ABNORMAL LOW (ref 35.0–47.0)
HEMATOCRIT: 22 % — AB (ref 35.0–47.0)
HEMOGLOBIN: 6.7 g/dL — AB (ref 12.0–16.0)
Hemoglobin: 7 g/dL — ABNORMAL LOW (ref 12.0–16.0)
MCH: 23.9 pg — AB (ref 26.0–34.0)
MCH: 24.6 pg — AB (ref 26.0–34.0)
MCHC: 29.8 g/dL — ABNORMAL LOW (ref 32.0–36.0)
MCHC: 30.4 g/dL — ABNORMAL LOW (ref 32.0–36.0)
MCV: 80.1 fL (ref 80.0–100.0)
MCV: 80.9 fL (ref 80.0–100.0)
PLATELETS: 408 10*3/uL (ref 150–440)
Platelets: 465 10*3/uL — ABNORMAL HIGH (ref 150–440)
RBC: 2.94 MIL/uL — AB (ref 3.80–5.20)
RBC: 2.71 MIL/uL — AB (ref 3.80–5.20)
RDW: 24.3 % — AB (ref 11.5–14.5)
RDW: 24.3 % — ABNORMAL HIGH (ref 11.5–14.5)
WBC: 26.6 10*3/uL — ABNORMAL HIGH (ref 3.6–11.0)
WBC: 25 10*3/uL — ABNORMAL HIGH (ref 3.6–11.0)

## 2015-07-10 LAB — TROPONIN I
Troponin I: 0.1 ng/mL — ABNORMAL HIGH (ref <0.031)
Troponin I: 0.11 ng/mL — ABNORMAL HIGH (ref <0.031)
Troponin I: 0.09 ng/mL — ABNORMAL HIGH (ref <0.031)

## 2015-07-10 LAB — MRSA PCR SCREENING: MRSA BY PCR: NEGATIVE

## 2015-07-10 LAB — MAGNESIUM: Magnesium: 1.7 mg/dL (ref 1.7–2.4)

## 2015-07-10 MED ORDER — IPRATROPIUM-ALBUTEROL 0.5-2.5 (3) MG/3ML IN SOLN: 3.0000 mL | Freq: Four times a day (QID) | RESPIRATORY_TRACT | Status: DC | PRN

## 2015-07-10 MED ORDER — INFLUENZA VAC SPLIT QUAD 0.5 ML IM SUSY
0.5000 mL | PREFILLED_SYRINGE | INTRAMUSCULAR | Status: DC
  Filled 2015-07-10 (×2): qty 0.5

## 2015-07-10 MED ORDER — MAGNESIUM SULFATE 2 GM/50ML IV SOLN
2.0000 g | Freq: Once | INTRAVENOUS | Status: AC
  Administered 2015-07-10: 2 g via INTRAVENOUS
  Filled 2015-07-10: qty 50

## 2015-07-10 MED ORDER — METOPROLOL TARTRATE 25 MG PO TABS
25.0000 mg | ORAL_TABLET | Freq: Two times a day (BID) | ORAL | Status: DC
  Administered 2015-07-10: 25 mg via ORAL
  Filled 2015-07-10 (×3): qty 1

## 2015-07-10 MED ORDER — DILTIAZEM HCL 25 MG/5ML IV SOLN: 10.0000 mg | INTRAVENOUS | Status: DC

## 2015-07-10 MED ORDER — FERROUS SULFATE 325 (65 FE) MG PO TABS
325.0000 mg | ORAL_TABLET | Freq: Every day | ORAL | Status: DC
  Filled 2015-07-10 (×2): qty 1

## 2015-07-10 MED ORDER — VANCOMYCIN HCL IN DEXTROSE 750-5 MG/150ML-% IV SOLN
750.0000 mg | INTRAVENOUS | Status: DC
  Administered 2015-07-10: 750 mg via INTRAVENOUS
  Filled 2015-07-10 (×2): qty 150

## 2015-07-10 MED ORDER — DILTIAZEM HCL 25 MG/5ML IV SOLN
INTRAVENOUS | Status: AC
  Administered 2015-07-10: 18:00:00
  Filled 2015-07-10: qty 5

## 2015-07-10 MED ORDER — DEXTROSE 5 % IV SOLN
5.0000 mg/h | INTRAVENOUS | Status: DC
  Administered 2015-07-10: 5 mg/h via INTRAVENOUS
  Filled 2015-07-10: qty 100

## 2015-07-10 MED ORDER — PIPERACILLIN-TAZOBACTAM 3.375 G IVPB
3.3750 g | Freq: Three times a day (TID) | INTRAVENOUS | Status: DC
  Administered 2015-07-10 – 2015-07-11 (×5): 3.375 g via INTRAVENOUS
  Filled 2015-07-10 (×8): qty 50

## 2015-07-10 MED ORDER — DULOXETINE HCL 30 MG PO CPEP
30.0000 mg | ORAL_CAPSULE | Freq: Two times a day (BID) | ORAL | Status: DC
  Administered 2015-07-10: 30 mg via ORAL
  Filled 2015-07-10 (×3): qty 1

## 2015-07-10 MED ORDER — MEMANTINE HCL 5 MG PO TABS
10.0000 mg | ORAL_TABLET | Freq: Two times a day (BID) | ORAL | Status: DC
  Administered 2015-07-10: 10 mg via ORAL
  Filled 2015-07-10 (×2): qty 1
  Filled 2015-07-10: qty 2

## 2015-07-10 MED ORDER — LORAZEPAM 2 MG/ML IJ SOLN
INTRAMUSCULAR | Status: AC
  Filled 2015-07-10: qty 1

## 2015-07-10 MED ORDER — SODIUM CHLORIDE 0.9 % IV SOLN
Freq: Once | INTRAVENOUS | Status: AC
  Administered 2015-07-10: 22:00:00 via INTRAVENOUS

## 2015-07-10 MED ORDER — DILTIAZEM HCL ER COATED BEADS 120 MG PO CP24
120.0000 mg | ORAL_CAPSULE | Freq: Every day | ORAL | Status: DC
  Administered 2015-07-10: 120 mg via ORAL
  Filled 2015-07-10: qty 1

## 2015-07-10 MED ORDER — ACETAMINOPHEN 650 MG RE SUPP
650.0000 mg | Freq: Once | RECTAL | Status: AC
Start: 2015-07-10 — End: 2015-07-11
  Administered 2015-07-11: 650 mg via RECTAL
  Filled 2015-07-10: qty 1

## 2015-07-10 MED ORDER — ENSURE ENLIVE PO LIQD
237.0000 mL | Freq: Two times a day (BID) | ORAL | Status: DC
  Administered 2015-07-11: 237 mL via ORAL

## 2015-07-10 MED ORDER — DILTIAZEM LOAD VIA INFUSION
10.0000 mg | Freq: Once | INTRAVENOUS | Status: AC
  Administered 2015-07-10: 10 mg via INTRAVENOUS
  Filled 2015-07-10: qty 10

## 2015-07-10 MED ORDER — POLYETHYLENE GLYCOL 3350 17 G PO PACK: 17.0000 g | PACK | Freq: Every evening | ORAL | Status: DC

## 2015-07-10 MED ORDER — LORAZEPAM 2 MG/ML IJ SOLN
0.5000 mg | Freq: Three times a day (TID) | INTRAMUSCULAR | Status: DC | PRN
  Administered 2015-07-10 – 2015-07-11 (×3): 0.5 mg via INTRAVENOUS
  Filled 2015-07-10 (×3): qty 1

## 2015-07-10 MED ORDER — ASPIRIN EC 81 MG PO TBEC
81.0000 mg | DELAYED_RELEASE_TABLET | Freq: Every day | ORAL | Status: DC
  Filled 2015-07-10 (×2): qty 1

## 2015-07-10 MED ORDER — SENNOSIDES-DOCUSATE SODIUM 8.6-50 MG PO TABS
2.0000 | ORAL_TABLET | Freq: Every day | ORAL | Status: DC
  Filled 2015-07-10 (×2): qty 2

## 2015-07-10 MED ORDER — RISPERIDONE 1 MG PO TABS
1.0000 mg | ORAL_TABLET | Freq: Two times a day (BID) | ORAL | Status: DC
  Filled 2015-07-10 (×5): qty 1

## 2015-07-10 MED ORDER — DIPHENHYDRAMINE HCL 50 MG/ML IJ SOLN: 25.0000 mg | Freq: Once | INTRAMUSCULAR | Status: DC

## 2015-07-10 MED ORDER — DILTIAZEM HCL 25 MG/5ML IV SOLN
10.0000 mg | INTRAVENOUS | Status: DC | PRN
  Administered 2015-07-11 (×2): 10 mg via INTRAVENOUS
  Filled 2015-07-10 (×2): qty 5

## 2015-07-10 MED ORDER — LORAZEPAM 0.5 MG PO TABS: 0.5000 mg | ORAL_TABLET | Freq: Three times a day (TID) | ORAL | Status: DC | PRN

## 2015-07-10 MED ORDER — NITROGLYCERIN 0.4 MG SL SUBL
0.4000 mg | SUBLINGUAL_TABLET | SUBLINGUAL | Status: DC | PRN
  Administered 2015-07-10: 0.4 mg via SUBLINGUAL
  Filled 2015-07-10: qty 1

## 2015-07-10 NOTE — Progress Notes (Signed)
Skin verified on admission by Crystal B.

## 2015-07-10 NOTE — Progress Notes (Signed)
Per MD Elisabeth Pigeon transfer patient to step down for a cardizem drip

## 2015-07-10 NOTE — Progress Notes (Addendum)
ANTIBIOTIC CONSULT NOTE - INITIAL  Pharmacy Consult for Zosyn/vancomycin Indication: rule out sepsis  Allergies  Allergen Reactions  . Sulfa Antibiotics Anaphylaxis  . Benzodiazepines Other (See Comments)    Reaction:  Unknown   . Oxycodone-Acetaminophen Other (See Comments)    Reaction:  Unknown     Patient Measurements: Height:  (160 cm) Weight: 115 lb (52.164 kg) IBW/kg (Calculated) : 52.4 Adjusted Body Weight: 52.2 kg  Vital Signs: Temp: 98 F (36.7 C) (08/31 0053) Temp Source: Oral (08/31 0053) BP: 118/70 mmHg (08/31 0053) Pulse Rate: 112 (08/31 0053) Intake/Output from previous day:   Intake/Output from this shift:    Labs:  Recent Labs  07/09/15 2005  WBC 29.2*  HGB 8.4*  PLT 513*  CREATININE 1.03*   Estimated Creatinine Clearance: 35.3 mL/min (by C-G formula based on Cr of 1.03). No results for input(s): VANCOTROUGH, VANCOPEAK, VANCORANDOM, GENTTROUGH, GENTPEAK, GENTRANDOM, TOBRATROUGH, TOBRAPEAK, TOBRARND, AMIKACINPEAK, AMIKACINTROU, AMIKACIN in the last 72 hours.   Microbiology: No results found for this or any previous visit (from the past 720 hour(s)).  Medical History: Past Medical History  Diagnosis Date  . Hypertension   . Diabetes mellitus without complication   . Dementia   . Arthritis   . History of echocardiogram     a. echo 03/2015: EF 55-60%, LA/RA mildly dilated, no AI    Medications:  Infusions:  . sodium chloride 75 mL/hr at 07/10/15 0000   Assessment: 83 yof cc SOB at nursing facility sent to ED for work up found leukocytosis and tachycardia, code sepsis initiated.  Vd 36.6 L, Ke 0.0338 hr-1, T1/2 20.5 hr, Predicted trough 17 mcg/mL  Goal of Therapy:  Vancomycin trough level 15-20 mcg/ml  Plan:  Expected duration 7 days with resolution of temperature and/or normalization of WBC. Zosyn 3.375 gm IV Q8H EI and vancomycin 750 mg IV Q24H withOUT stacked dosing since "loaded" in ED, will check trough before fourth dose and  adjust as necessary to maintain trough 15 to 20 mcg/mL.  Carola Frost, Pharm.D. Clinical Pharmacist 07/10/2015,12:57 AM

## 2015-07-10 NOTE — Progress Notes (Signed)
Inpatient Diabetes Program Recommendations  AACE/ADA: New Consensus Statement on Inpatient Glycemic Control (2013)  Target Ranges:  Prepandial:   less than 140 mg/dL      Peak postprandial:   less than 180 mg/dL (1-2 hours)      Critically ill patients:  140 - 180 mg/dL  Results for MAYAN, DOLNEY (MRN [id number]) as of 07/10/2015 11:07  Ref. Range 07/09/2015 20:05 07/09/2015 22:04 07/09/2015 22:05 07/10/2015 01:02  Glucose Latest Ref Range: 65-99 mg/dL 409 (H)   811 (H)   Inpatient Diabetes Program Recommendations Correction (SSI): start sensitive scale per Glycemic Control order-set Thank you  Piedad Climes BSN, RN,CDE Inpatient Diabetes Coordinator [phone] (team pager)

## 2015-07-10 NOTE — Progress Notes (Signed)
[facility] [place] [facility]   PATIENT NAME: Madison Montoya    MR#:  [id number]  DATE OF BIRTH:  04-Mar-1932  SUBJECTIVE:  CHIEF COMPLAINT:   Chief Complaint  Patient presents with  . Shortness of Breath   admitted for sepsis with UTI.  Patient is drowsy  Poor appetite  REVIEW OF SYSTEMS:    Review of Systems  Unable to perform ROS: dementia      DRUG ALLERGIES:   Allergies  Allergen Reactions  . Sulfa Antibiotics Anaphylaxis  . Benzodiazepines Other (See Comments)    Reaction:  Unknown   . Oxycodone-Acetaminophen Other (See Comments)    Reaction:  Unknown     VITALS:  Blood pressure 133/65, pulse 94, temperature 97.6 F (36.4 C), temperature source Oral, resp. rate 18, height 5\' 3"  (1.6 m), weight 50.349 kg (111 lb), SpO2 98 %.  PHYSICAL EXAMINATION:   Physical Exam  GENERAL:  81 y.o.-year-old patient lying in the bed with no acute distress. Thin critically ill EYES: Pupils equal, round, reactive to light and accommodation. No scleral icterus. Extraocular muscles intact.  HEENT: Head atraumatic, normocephalic. Oropharynx and nasopharynx clear.  NECK:  Supple, no jugular venous distention. No thyroid enlargement, no tenderness.  LUNGS: Normal breath sounds bilaterally, no wheezing, rales, rhonchi. No use of accessory muscles of respiration.  CARDIOVASCULAR: S1, S2 normal. No murmurs, rubs, or gallops.  ABDOMEN: Soft, nontender, nondistended. Bowel sounds present. No organomegaly or mass.  EXTREMITIES: No cyanosis, clubbing or edema b/l.    NEUROLOGIC: Cranial nerves II through XII are intact. No focal Motor or sensory deficits b/l.   PSYCHIATRIC: The patient is drowsy SKIN: No obvious rash, lesion, or ulcer.    LABORATORY PANEL:   CBC  Recent Labs Lab 07/10/15 0102  WBC 26.6*  HGB 7.0*  HCT 23.6*  PLT 408    ------------------------------------------------------------------------------------------------------------------  Chemistries   Recent Labs Lab 07/09/15 2005 07/10/15 0102  NA 137 136  K 3.8 3.7  CL 105 109  CO2 23 23  GLUCOSE 202* 208*  BUN 37* 34*  CREATININE 1.03* 0.98  CALCIUM 9.1 8.1*  AST 26  --   ALT 17  --   ALKPHOS 94  --   BILITOT 0.8  --    ------------------------------------------------------------------------------------------------------------------  Cardiac Enzymes  Recent Labs Lab 07/10/15 0715  TROPONINI 0.11*   ------------------------------------------------------------------------------------------------------------------  RADIOLOGY:  Ct Head Wo Contrast  07/09/2015   CLINICAL DATA:  Altered mental status  EXAM: CT HEAD WITHOUT CONTRAST  TECHNIQUE: Contiguous axial images were obtained from the base of the skull through the vertex without intravenous contrast.  COMPARISON:  None.  FINDINGS: Mild cortical volume loss noted with proportional ventricular prominence. Areas of periventricular white matter hypodensity are most compatible with small vessel ischemic change. No acute hemorrhage, infarct, or mass lesion is identified. CSF density bilateral remote basal ganglial lacunar infarcts are noted. No skull fracture. Orbits and paranasal sinuses are unremarkable.  IMPRESSION: Chronic findings as above, no acute intracranial abnormality.   Electronically Signed   By: Christiana Pellant M.D.   On: 07/09/2015 22:51   Dg Chest Port 1 View  07/09/2015   CLINICAL DATA:  Acute onset of sepsis. Shortness of breath. Initial encounter.  EXAM: PORTABLE CHEST - 1 VIEW  COMPARISON:  Chest radiograph performed 05/08/2015  FINDINGS: The lungs are well-aerated. Peribronchial thickening is noted. Mild bibasilar atelectasis is noted. There is no evidence of pleural effusion or pneumothorax.  The cardiomediastinal silhouette is borderline  normal in size. No acute osseous  abnormalities are seen.  IMPRESSION: Peribronchial thickening noted.  Mild bibasilar atelectasis seen.   Electronically Signed   By: Roanna Raider M.D.   On: 07/09/2015 20:41     ASSESSMENT AND PLAN:   * Sepsis due to urinary tract infection. On broad-spectrum antibiotics at this time. We will wait for final culture results. Acute encephalopathy due to UTI. IV fluids.  * Hypertension Home medications  * Diabetes mellitus Sliding scale insulin  * Dementia Watch for inpatient delirium  * Malnutrition Nutritional supplements  * Anemia of chronic disease Obtain consent from daughter in case patient needs any transfusion. Transfuse if less than 7.      All the records are reviewed and case discussed with Care Management/Social Workerr. Management plans discussed with the patient, family and they are in agreement.  CODE STATUS: FULL Discussed with daughter regarding CODE STATUS. She mentions that she is discussing with her siblings. They are leaning towards DO NOT RESUSCITATE and would like to confirm that everybody before making a decision.  DVT Prophylaxis: SCDs  TOTAL TIME TAKING CARE OF THIS PATIENT: 35 minutes.   POSSIBLE D/C IN 2-3 DAYS, DEPENDING ON CLINICAL CONDITION.   Milagros Loll R M.D on 07/10/2015 at 2:12 PM  Between 7am to 6pm - Pager - [phone]  After 6pm go to [email or web address] - password [email or web address] [facility]  [place] [facility]  Office  [phone]  CC: Primary care physician; Tillman Abide, MD

## 2015-07-10 NOTE — Progress Notes (Signed)
Dr Anne Hahn notified of CBC results.  Discussed plan of care regarding DVT prophylaxis and hx GIB in light of drop in H/H.  Orders received and implemented.  Family updated on pt status and plan of care.

## 2015-07-10 NOTE — ED Notes (Signed)
Patient's son leaving at this time. Will return call to the hospital later on tonight to determine patient room assignment.

## 2015-07-10 NOTE — Progress Notes (Signed)
Initial Nutrition Assessment  DOCUMENTATION CODES:   Severe malnutrition in context of acute illness/injury  INTERVENTION:   Meals and Snacks: Cater to patient preferences; will recommend dysphagia III as pt daughter reports pt has trouble chewing. Will send soups on lunch and dinner.  Medical Food Supplement Therapy: Agree with Ensure as ordered. Will add Magic Cup for lunch and dinner.    NUTRITION DIAGNOSIS:   Inadequate oral intake related to lethargy/confusion, acute illness as evidenced by meal completion < 25%, per patient/family report.  GOAL:   Patient will meet greater than or equal to 90% of their needs  MONITOR:    (Energy Intake, Anthropometrics, digestive System, Pulmonary Profile)  REASON FOR ASSESSMENT:   Consult Assessment of nutrition requirement/status  ASSESSMENT:   Pt admitted with repsiratory distress and sepsis with UTI. Pt lethargic, sleeping on visit.  Past Medical History  Diagnosis Date  . Hypertension   . Diabetes mellitus without complication   . Dementia   . Arthritis   . History of echocardiogram     a. echo 03/2015: EF 55-60%, LA/RA mildly dilated, no AI     Diet Order:  Diet Heart Room service appropriate?: Yes; Fluid consistency:: Thin    Current Nutrition: Pt has eaten 0% of lunch today, sleeping most of day.   Food/Nutrition-Related History: Per daughter pt has been eating 25-50% of meals for the past week at facility. Daughter reports pt has been sleeping most of the days recently. Daughter reports pt offered Med Pass nutrition shakes at facility PTA but not really drinking well.   Medications: NS at 55mL/hr, senokot, Miralax, ferrous sulfate  Electrolyte/Renal Profile and Glucose Profile:   Recent Labs Lab 07/09/15 2005 07/10/15 0102  NA 137 136  K 3.8 3.7  CL 105 109  CO2 23 23  BUN 37* 34*  CREATININE 1.03* 0.98  CALCIUM 9.1 8.1*  GLUCOSE 202* 208*   Protein Profile:  Recent Labs Lab 07/09/15 2005  ALBUMIN  2.8*    Gastrointestinal Profile: Last BM: unkown   Nutrition-Focused Physical Exam Findings: Nutrition-Focused physical exam completed. Findings are mild-moderate fat depletion, mild-moderate muscle depletion, and no edema.    Weight Change: Per [facility], pt with weight loss of 15-18% in the past 2-3 months.  Height:   Ht Readings from Last 1 Encounters:  07/09/15  (1.6 m)    Weight:   Wt Readings from Last 1 Encounters:  07/10/15 111 lb (50.349 kg)    Wt Readings from Last 10 Encounters:  07/10/15 111 lb (50.349 kg)  05/08/15 130 lb (58.968 kg)  04/03/15 124 lb (56.246 kg)  03/25/15 136 lb (61.689 kg)    BMI:  Body mass index is 19.67 kg/(m^2).  Estimated Nutritional Needs:   Kcal:  1238-1463kcals, BEE: 938kcals, TEE: (IF 1.1-1.3)(AF 1.2)   Protein:  40-50g protein (0.8-1.0g/kg)   Fluid:  1260-1553mL of fluid (25-58mL/kg)   EDUCATION NEEDS:   No education needs identified at this time   HIGH Care Level  Leda Quail, RD, LDN Pager [phone]

## 2015-07-10 NOTE — Telephone Encounter (Signed)
Will follow along Reviewed hospital records Spoke to son  Please put me on as PCP

## 2015-07-10 NOTE — Progress Notes (Signed)
I was called by nurse as pt have A fib with RVR.  I reviewed the chart- pt is here for sepsis with RVR. She is on broad spectrum Abx, she also had RVR on admission- responded tp IV fluids.  In last 1-2 hrs- she is given Inj ativan, Cardizem Inj 10 mg, ABG and X ray are done.  Exam- Pt is in distress, on NRBM. CVS- Rapid HR- irregular. RS- B/l clear, no creps. ABD- no tenderness. Edema present on feet- which is new per nurse.  HR - 150, O2 Saturation - 100%  Labs   Reviewed   ABG- respi alkalosis- due to tachypnea, O2 sat satisfactory.   Xray- no report- but to me it looks mild edema.   Troponin was stable till this morning.   Has a drop in Hb- compared to last night.  Old chart reviewed.   Echo - EF 50-60%   As per notes- she had GI bleed and so was not a candidate for anticoagulation ( per Dr. Mariah Milling in May 2016)   HR was under control with A fib.  Assessment and plan.  1) A fib with RVR 2) ac on ch anemia 3) sepsis with UTI    Likely due to sepsis, and drop in Hb      I spoke to Dr. Herbie Baltimore-    Cardizem loading dose 10 mg IV and then drip.   If we are not able to maintain the blood pressure with that then we will have to switch to amiodarone drip.       I ordered stat CBC, BMP, troponin, magnesium- I will follow on them and act accordingly.    I spoke to patient's daughter who is present in the room, and she conveyed her whole families decision to me- that they would like to keep the patient DO NOT RESUSCITATE in any adverse event.   I explained them about the need of IV rate controlling medications, and so placement in ICU- they are okay with that. I also asked them about their permission for blood transfusion if required, they approved that also.    I will follow CBC and BMP reports- and if needed and will give blood transfusion to her, I also requested a stool guaiac test, as she has history of GI bleed and her hemoglobin has a drop after admission.  I will not  start on any anticoagulation at this time, due to combination of drop in hemoglobin and history of GI bleed in the past.  Total critical care time spent in managing this patient is 50 minutes.

## 2015-07-10 NOTE — Progress Notes (Signed)
Spoke w/ eLink re: pt's HR and BP and cardizem being titrated to minimum rate.  Agreed w/ plan to d/c cardizem gtt and change to prn IVP.  Pt currently unable to safely swallow any po medication.

## 2015-07-10 NOTE — Telephone Encounter (Signed)
PLEASE NOTE: All timestamps contained within this report are represented as [place] Standard Time. CONFIDENTIALTY NOTICE: This fax transmission is intended only for the addressee. It contains information that is legally privileged, confidential or otherwise protected from use or disclosure. If you are not the intended recipient, you are strictly prohibited from reviewing, disclosing, copying using or disseminating any of this information or taking any action in reliance on or regarding this information. If you have received this fax in error, please notify us immediately by telephone so that we can arrange for its return to [place]. Phone: [phone], Toll-Free: [phone], Fax: [phone] Page: 1 of 1 Call Id: [id number] [facility] Night - Client TELEPHONE ADVICE RECORD [facility] Medical Call Center Patient Name: Madison Montoya Gender: Female DOB: 08-10-1932 Age: 81 Y 4 M 6 D Return Phone Number: Address: City/State/Zip: [place] [facility] Night - Client Client Site [facility] [place] - Night Physician Tillman Abide Contact Type Call Call Type Page Only Caller Name Thurston Hole Relationship To Patient Provider Is this call to report lab results? No Return Phone Number Please choose phone number Initial Comment Caller states Thurston Hole from [facility] and resident's saturations on 2 liters of oxygen is 81% and turned it up to 3 and is at 86%. CB# [phone] Nurse Assessment Guidelines Guideline Title Affirmed Question Affirmed Notes Nurse Date/Time (Eastern Time) Disp. Time Lamount Cohen Time) Disposition Final User 07/09/2015 7:17:00 PM Called On-Call Provider De Hollingshead 07/09/2015 7:17:12 PM Page Completed Yes De Hollingshead After Care Instructions Given Call Event Type User Date / Time Description Paging DoctorName Phone DateTime Result/Outcome Message Type Notes Derryl Harbor [phone] 07/09/2015 7:17:00  PM Called On Call Provider - Reached Doctor Paged Derryl Harbor 07/09/2015 7:17:03 PM Spoke with On Call - General Message Result

## 2015-07-10 NOTE — Progress Notes (Signed)
Pt cont to be in afib RVR, now c/o CP.  Dr Elisabeth Pigeon notified.  Orders received for nitro SL and EKG. Troponin already ordered.

## 2015-07-10 NOTE — Telephone Encounter (Signed)
Pt was admitted to [facility] Rm 241 on 07/09/15.

## 2015-07-10 NOTE — Clinical Social Work Note (Signed)
Clinical Social Work Assessment  Patient Details  Name: Madison Montoya MRN: [id number] Date of Birth: 1932/09/07  Date of referral:  07/10/15               Reason for consult:  Facility Placement (pt is from [facility])                Permission sought to share information with:  Facility Medical sales representative, Family Supports Permission granted to share information::  No (pt would not wake for CSW)  Name::        Agency::     Relationship::     Contact Information:     Housing/Transportation Living arrangements for the past 2 months:  Skilled Nursing Facility Source of Information:  Other (Comment Required) (adult granddaughter) Patient Interpreter Needed:  None Criminal Activity/Legal Involvement Pertinent to Current Situation/Hospitalization:  No - Comment as needed Significant Relationships:  Adult Children, Other Family Members Lives with:  Facility Resident Do you feel safe going back to the place where you live?  Yes Need for family participation in patient care:  Yes (Comment)  Care giving concerns:  None expressed at this time by family   Social Worker assessment / plan:  CSW attempted to speak to pt, however she was sleeping heavily and would not wake for assessment.  CSW was able to speak to pt's granddaughter.  She was able to confirm that pt was from [facility].  CSW will fill out FL2 in the morning.  Current plan is to DC back to SNF once medically stable.  CSW will continue to follow  Employment status:  Disabled (Comment on whether or not currently receiving Disability), Retired Health and safety inspector:  Medicare PT Recommendations:    Information / Referral to community resources:     Patient/Family's Response to care:  Pt's granddaughter was in agreement with return to SNF  Patient/Family's Understanding of and Emotional Response to Diagnosis, Current Treatment, and Prognosis:  Pt's granddaugher verbalized her understanding of return to SNF DC and was  agreement with return to SNF.    Emotional Assessment Appearance:  Appears stated age Attitude/Demeanor/Rapport:  Unable to Assess Affect (typically observed):  Unable to Assess Orientation:    Alcohol / Substance use:  Never Used Psych involvement (Current and /or in the community):  No (Comment)  Discharge Needs  Concerns to be addressed:  Care Coordination Readmission within the last 30 days:  No Current discharge risk:  Physical Impairment Barriers to Discharge:  No Barriers Identified   Chauncy Passy, LCSW 07/10/2015, 5:43 PM

## 2015-07-10 NOTE — Care Management (Signed)
Spoke with son Fayrene Fearing.  Patient is from [facility] and he says patient is under a "long term care" plan of treatment.  When medically stable, he would ant her to return to [facility].  Discussed during progression it may be of benefit to discuss full code status.

## 2015-07-10 NOTE — Progress Notes (Signed)
eLink Physician-Brief Progress Note Patient Name: DRUANNE BOSQUES DOB: 12-16-1931 MRN: [id number]   Date of Service  07/10/2015  HPI/Events of Note  Eval on pt admitted w/ A fib w/ RVR. Also has sepsis on Vanc/Zosyn. Mag 1.7 & Hgb 6.7.  eICU Interventions  Elink RN spoke w/ bedside RN to relay recommendations/orders>>Mag Sulfate 2gm IV. Recommend transfusion & will defer to hospitalist.     Intervention Category Major Interventions: Arrhythmia - evaluation and management  Lawanda Cousins 07/10/2015, 9:01 PM

## 2015-07-11 ENCOUNTER — Encounter: Payer: Self-pay | Admitting: Physician Assistant

## 2015-07-11 DIAGNOSIS — A419 Sepsis, unspecified organism: Principal | ICD-10-CM

## 2015-07-11 DIAGNOSIS — I509 Heart failure, unspecified: Secondary | ICD-10-CM

## 2015-07-11 DIAGNOSIS — F0390 Unspecified dementia without behavioral disturbance: Secondary | ICD-10-CM

## 2015-07-11 DIAGNOSIS — E559 Vitamin D deficiency, unspecified: Secondary | ICD-10-CM

## 2015-07-11 DIAGNOSIS — R0602 Shortness of breath: Secondary | ICD-10-CM

## 2015-07-11 DIAGNOSIS — M129 Arthropathy, unspecified: Secondary | ICD-10-CM

## 2015-07-11 DIAGNOSIS — D649 Anemia, unspecified: Secondary | ICD-10-CM

## 2015-07-11 DIAGNOSIS — M1990 Unspecified osteoarthritis, unspecified site: Secondary | ICD-10-CM

## 2015-07-11 DIAGNOSIS — E46 Unspecified protein-calorie malnutrition: Secondary | ICD-10-CM

## 2015-07-11 DIAGNOSIS — Z515 Encounter for palliative care: Secondary | ICD-10-CM

## 2015-07-11 DIAGNOSIS — N390 Urinary tract infection, site not specified: Secondary | ICD-10-CM

## 2015-07-11 DIAGNOSIS — I4891 Unspecified atrial fibrillation: Secondary | ICD-10-CM

## 2015-07-11 DIAGNOSIS — J439 Emphysema, unspecified: Secondary | ICD-10-CM

## 2015-07-11 DIAGNOSIS — I482 Chronic atrial fibrillation: Secondary | ICD-10-CM

## 2015-07-11 DIAGNOSIS — I10 Essential (primary) hypertension: Secondary | ICD-10-CM

## 2015-07-11 DIAGNOSIS — J9811 Atelectasis: Secondary | ICD-10-CM

## 2015-07-11 DIAGNOSIS — K5791 Diverticulosis of intestine, part unspecified, without perforation or abscess with bleeding: Secondary | ICD-10-CM

## 2015-07-11 DIAGNOSIS — I517 Cardiomegaly: Secondary | ICD-10-CM

## 2015-07-11 DIAGNOSIS — F419 Anxiety disorder, unspecified: Secondary | ICD-10-CM

## 2015-07-11 DIAGNOSIS — Z66 Do not resuscitate: Secondary | ICD-10-CM

## 2015-07-11 DIAGNOSIS — K5900 Constipation, unspecified: Secondary | ICD-10-CM

## 2015-07-11 DIAGNOSIS — E119 Type 2 diabetes mellitus without complications: Secondary | ICD-10-CM

## 2015-07-11 LAB — BASIC METABOLIC PANEL
Anion gap: 5 (ref 5–15)
BUN: 26 mg/dL — AB (ref 6–20)
CALCIUM: 8.4 mg/dL — AB (ref 8.9–10.3)
CHLORIDE: 110 mmol/L (ref 101–111)
CO2: 22 mmol/L (ref 22–32)
CREATININE: 0.97 mg/dL (ref 0.44–1.00)
GFR calc non Af Amer: 53 mL/min — ABNORMAL LOW (ref >60)
GLUCOSE: 156 mg/dL — AB (ref 65–99)
Potassium: 3.9 mmol/L (ref 3.5–5.1)
Sodium: 137 mmol/L (ref 135–145)

## 2015-07-11 LAB — CBC WITH DIFFERENTIAL/PLATELET
Basophils Absolute: 0.1 10*3/uL (ref 0–0.1)
EOS ABS: 0.1 10*3/uL (ref 0–0.7)
HCT: 28.3 % — ABNORMAL LOW (ref 35.0–47.0)
Hemoglobin: 9 g/dL — ABNORMAL LOW (ref 12.0–16.0)
LYMPHS ABS: 1 10*3/uL (ref 1.0–3.6)
Lymphocytes Relative: 5 %
MCH: 27.6 pg (ref 26.0–34.0)
MCHC: 31.6 g/dL — AB (ref 32.0–36.0)
MCV: 87.2 fL (ref 80.0–100.0)
MONO ABS: 1 10*3/uL — AB (ref 0.2–0.9)
Neutro Abs: 16.7 10*3/uL — ABNORMAL HIGH (ref 1.4–6.5)
PLATELETS: 368 10*3/uL (ref 150–440)
RBC: 3.25 MIL/uL — ABNORMAL LOW (ref 3.80–5.20)
RDW: 23.3 % — AB (ref 11.5–14.5)
WBC: 18.8 10*3/uL — ABNORMAL HIGH (ref 3.6–11.0)

## 2015-07-11 LAB — TROPONIN I: TROPONIN I: 0.09 ng/mL — AB (ref <0.031)

## 2015-07-11 LAB — URINE CULTURE

## 2015-07-11 MED ORDER — DILTIAZEM HCL ER COATED BEADS 120 MG PO CP24
120.0000 mg | ORAL_CAPSULE | Freq: Every day | ORAL | Status: DC
  Filled 2015-07-11: qty 1

## 2015-07-11 MED ORDER — MORPHINE SULFATE (PF) 2 MG/ML IV SOLN
2.0000 mg | INTRAVENOUS | Status: DC | PRN
  Administered 2015-07-11 (×2): 2 mg via INTRAVENOUS
  Filled 2015-07-11 (×2): qty 1

## 2015-07-11 MED ORDER — LORAZEPAM 2 MG/ML IJ SOLN
0.5000 mg | INTRAMUSCULAR | Status: DC | PRN
  Administered 2015-07-11: 0.5 mg via INTRAVENOUS
  Filled 2015-07-11: qty 1

## 2015-07-11 MED ORDER — HALOPERIDOL LACTATE 5 MG/ML IJ SOLN
2.0000 mg | Freq: Three times a day (TID) | INTRAMUSCULAR | Status: DC | PRN
  Administered 2015-07-11: 2 mg via INTRAVENOUS
  Filled 2015-07-11: qty 1

## 2015-07-11 MED ORDER — DEXTROSE 5 % IV SOLN
5.0000 mg/h | INTRAVENOUS | Status: DC
  Administered 2015-07-11: 5 mg/h via INTRAVENOUS
  Filled 2015-07-11: qty 100

## 2015-07-11 MED ORDER — MORPHINE SULFATE (PF) 2 MG/ML IV SOLN
2.0000 mg | Freq: Four times a day (QID) | INTRAVENOUS | Status: DC
  Administered 2015-07-11 – 2015-07-12 (×3): 2 mg via INTRAVENOUS
  Filled 2015-07-11 (×3): qty 1

## 2015-07-11 MED ORDER — FUROSEMIDE 20 MG PO TABS
20.0000 mg | ORAL_TABLET | Freq: Every day | ORAL | Status: DC
  Filled 2015-07-11: qty 1

## 2015-07-11 MED ORDER — HALOPERIDOL LACTATE 2 MG/ML PO CONC
2.0000 mg | Freq: Three times a day (TID) | ORAL | Status: DC | PRN
  Filled 2015-07-11: qty 1

## 2015-07-11 MED ORDER — LORAZEPAM 2 MG/ML IJ SOLN: 1.0000 mg | INTRAMUSCULAR | Status: DC | PRN

## 2015-07-11 NOTE — Consult Note (Signed)
Palliative Medicine Inpatient Consult Note   Name: Madison Montoya Date: 07/11/2015 MRN: [id number]  DOB: [birth year]  Referring Physician: Milagros Loll, MD   Primary Care Doctor is Karie Schwalbe MD (at [facility] at [facility]).   Palliative Care consult requested for this 81 y.o. female for goals of medical therapy in patient with dyspnea, sepsis due to UTI, dementia, anemia and other chronic and acute problems.  According to family, she has been 'going downhill' mentally and physically over the last couple of months and she has been at [facility] for about 6 months time.  Pt is a widow and has five adult children, 2 of which are here holding pts hands while she moans.    TODAY'S CONVERSATIONS, EVENTS, AND PLANS:  1.  Pt is DNR status and this continues.  2.  Family was recently put back on a Cardizem drip as the boluses and other measures were not effective. So far, the drip has only brought her HR down to the 130's from the 150's.    3.   Family expressed their concern that she is suffering. They asked about how they should approach 'just making her comfortable' instead of doing all that we are doing. I informed them that we could start comfort orders now/ immediately --or we could do this in the morning if we need time to get the approval of a majority of pts adult children. (Pt has no Living Will and No HCPOA, so by law, decisions are to be made by the majority of available children --given that she has no living spouse and that she has children).    4.  The son and daughter present today have texted their siblings and I have been shown evidence of approval of this approach.  Daughter, Moses Manners (who lives in [place]) and son, Yahira, Timberman (from [employer]) are here and they requested a comfort care approach.  I spoke by phone with daughter, Darel Hong, (who lives in [place]) and son Leyana Whidden (from [place]) and they indicated agreement at this time with comfort  care only.  I saw a text from son, Taelynn Mcelhannon (of [place]) who said he agreed with comfort care starting tonight.    5.  Comfort orders have been put in place and other orders are DCd.    Nurse may pronounce. Chaplain was made aware.  [facility] on call was made aware.   I will leave at this time and hope for a good exit for this patient under comfort care orders initiated by a very loving and unselfish family.   6.  Allergies to oxycodone and benzos are DCd as family said these were not meds she was ever allergic to.      REVIEW OF SYSTEMS:  Patient is not able to provide ROS due to dementia  SPIRITUAL SUPPORT SYSTEM: Yes  --family.  SOCIAL HISTORY:  reports that she has never smoked. She does not have any smokeless tobacco history on file. She reports that she does not drink alcohol. Resides in [facility] facility.    LEGAL DOCUMENTS:  None found  CODE STATUS: DNR  PAST MEDICAL HISTORY: Past Medical History  Diagnosis Date  . Hypertension   . Diabetes mellitus without complication   . Dementia   . Arthritis   . History of echocardiogram     a. echo 03/2015: EF 55-60%, LA/RA mildly dilated, no AI  . Chronic atrial fibrillation     a. no on long  term full dose anticoagulation    PAST SURGICAL HISTORY:  Past Surgical History  Procedure Laterality Date  . Fracture surgery    . Cardiac catheterization      [facility]    ALLERGIES:  is allergic to sulfa antibiotics; benzodiazepines; and oxycodone-acetaminophen.  MEDICATIONS:  Current Facility-Administered Medications  Medication Dose Route Frequency Provider Last Rate Last Dose  . 0.9 %  sodium chloride infusion   Intravenous Continuous Wyatt Haste, MD 75 mL/hr at 07/11/15 [phone]    . acetaminophen (TYLENOL) tablet 650 mg  650 mg Oral Q6H PRN Wyatt Haste, MD       Or  . acetaminophen (TYLENOL) suppository 650 mg  650 mg Rectal Q6H PRN Wyatt Haste, MD      . diltiazem (CARDIZEM CD) 24 hr capsule  120 mg  120 mg Oral Daily Ryan M Dunn, PA-C   120 mg at 07/11/15 1000  . diltiazem (CARDIZEM) injection 10 mg  10 mg Intravenous Q1H PRN Shane Crutch, MD   10 mg at 07/11/15 1226  . DULoxetine (CYMBALTA) DR capsule 30 mg  30 mg Oral BID Wyatt Haste, MD   30 mg at 07/10/15 0143  . feeding supplement (ENSURE ENLIVE) (ENSURE ENLIVE) liquid 237 mL  237 mL Oral BID BM Wyatt Haste, MD   237 mL at 07/10/15 1000  . furosemide (LASIX) tablet 20 mg  20 mg Oral Daily Ryan M Dunn, PA-C   20 mg at 07/11/15 1000  . Influenza vac split quadrivalent PF (FLUARIX) injection 0.5 mL  0.5 mL Intramuscular Tomorrow-1000 Wyatt Haste, MD   0.5 mL at 07/11/15 1024  . ipratropium-albuterol (DUONEB) 0.5-2.5 (3) MG/3ML nebulizer solution 3 mL  3 mL Nebulization Q4H PRN Wyatt Haste, MD   3 mL at 07/10/15 1806  . LORazepam (ATIVAN) injection 0.5 mg  0.5 mg Intravenous TID PRN Milagros Loll, MD   0.5 mg at 07/11/15 1227  . memantine (NAMENDA) tablet 10 mg  10 mg Oral BID Wyatt Haste, MD   10 mg at 07/10/15 0143  . metoprolol tartrate (LOPRESSOR) tablet 25 mg  25 mg Oral BID Wyatt Haste, MD   25 mg at 07/10/15 0143  . nitroGLYCERIN (NITROSTAT) SL tablet 0.4 mg  0.4 mg Sublingual Q5 min PRN Altamese Dilling, MD   0.4 mg at 07/10/15 1941  . ondansetron (ZOFRAN) tablet 4 mg  4 mg Oral Q6H PRN Wyatt Haste, MD       Or  . ondansetron [facility]) injection 4 mg  4 mg Intravenous Q6H PRN Wyatt Haste, MD      . piperacillin-tazobactam (ZOSYN) IVPB 3.375 g  3.375 g Intravenous 3 times per day Wyatt Haste, MD   3.375 g at 07/11/15 1236  . polyethylene glycol (MIRALAX / GLYCOLAX) packet 17 g  17 g Oral QPM Wyatt Haste, MD   17 g at 07/10/15 1800  . senna-docusate (Senokot-S) tablet 2 tablet  2 tablet Oral Daily Wyatt Haste, MD   2 tablet at 07/10/15 0932  . sodium chloride 0.9 % injection 3 mL  3 mL Intravenous Q12H Wyatt Haste, MD   3 mL at 07/10/15 0142  . vancomycin (VANCOCIN) IVPB 750 mg/150 ml premix   750 mg Intravenous Q24H Wyatt Haste, MD   750 mg at 07/10/15 2125    Vital Signs: BP 146/77 mmHg  Pulse 130  Temp(Src) 98 F (36.7 C) (Axillary)  Resp 45  Ht   (1.6 m)  Wt 50.349 kg (111 lb)  BMI 19.67 kg/m2  SpO2 96% Filed Weights   07/09/15 2006 07/10/15 0053  Weight: 52.164 kg (115 lb) 50.349 kg (111 lb)    Estimated body mass index is 19.67 kg/(m^2) as calculated from the following:   Height as of this encounter:  (1.6 m).   Weight as of this encounter: 50.349 kg (111 lb).  PERFORMANCE STATUS (ECOG) : 3 - Symptomatic, >50% confined to bed    PHYSICAL EXAM: She is restless, moving all about and moaning.  Two adult children are holding her hands and calming her. She barely opens eyes but not on command Neck shows tr JVD no TM Hrt tachy irreg irreg Lungs with rales in bases anteriorly Abd scant BS and no rebound Ext cachectic with protective dressings on feet/ ankles Skin is clammy and pale --no mottling oir cyanosis as yet       LABS: CBC:    Component Value Date/Time   WBC 18.8* 07/11/2015 0740   WBC 12.0* 06/24/2013 1334   HGB 9.0* 07/11/2015 0740   HGB 13.3 06/24/2013 1334   HCT 28.3* 07/11/2015 0740   HCT 38.3 06/24/2013 1334   PLT 368 07/11/2015 0740   PLT 288 06/24/2013 1334   MCV 87.2 07/11/2015 0740   MCV 92 06/24/2013 1334   NEUTROABS 16.7* 07/11/2015 0740   NEUTROABS 6.5 10/18/2012 0726   LYMPHSABS 1.0 07/11/2015 0740   LYMPHSABS 2.1 10/18/2012 0726   MONOABS 1.0* 07/11/2015 0740   MONOABS 1.2* 10/18/2012 0726   EOSABS 0.1 07/11/2015 0740   EOSABS 0.1 10/18/2012 0726   BASOSABS 0.1 07/11/2015 0740   BASOSABS 0.0 10/18/2012 0726   Comprehensive Metabolic Panel:    Component Value Date/Time   NA 137 07/11/2015 0740   NA 138 06/24/2013 1334   K 3.9 07/11/2015 0740   K 4.5 06/24/2013 1334   CL 110 07/11/2015 0740   CL 107 06/24/2013 1334   CO2 22 07/11/2015 0740   CO2 26 06/24/2013 1334   BUN 26* 07/11/2015 0740   BUN 24*  06/24/2013 1334   CREATININE 0.97 07/11/2015 0740   CREATININE 0.82 06/24/2013 1334   GLUCOSE 156* 07/11/2015 0740   GLUCOSE 82 06/24/2013 1334   CALCIUM 8.4* 07/11/2015 0740   CALCIUM 9.4 06/24/2013 1334   AST 26 07/09/2015 2005   AST 29 06/24/2013 1334   ALT 17 07/09/2015 2005   ALT 18 06/24/2013 1334   ALKPHOS 94 07/09/2015 2005   ALKPHOS 85 06/24/2013 1334   BILITOT 0.8 07/09/2015 2005   BILITOT 0.4 06/24/2013 1334   PROT 6.1* 07/09/2015 2005   PROT 7.0 06/24/2013 1334   ALBUMIN 2.8* 07/09/2015 2005   ALBUMIN 3.5 06/24/2013 1334   TESTS:  CXR 07/10/15: Vascular congestion and mild cardiomegaly. Peribronchial thickening seen. Mild bilateral atelectasis noted.  CT HEAD wo CM 07/10/15: Mild cortical volume loss noted with proportional ventricular prominence. Areas of periventricular white matter hypodensity are most compatible with small vessel ischemic change. No acute hemorrhage, infarct, or mass lesion is identified. CSF density bilateral remote basal ganglial lacunar infarcts are noted. No skull fracture. Orbits and paranasal sinuses are unremarkable.    IMPRESSION: 1.  Sepsis due to UTI ---broad spectrum ABX and IVF ---multiple species in urine cx and bld cx is neg to date 2. Persistent Afib (perviously on coumadin but not in a few months)  ---NOT A CANDIDATE FOR ANTICOAGULATION DUE TO GI BLEEDING (May 2016). --- with RVR ---  2 g Magnesium Sulfate given  ---Had echo 03/2015 showing EF 55-60% with LA and RA mildly dilated  2. Mild emphysema (seen on prior CT of a/p 03/16/15) 3. Diverticulosis on prior CT 4. Acute lower GI bleeding 5. Anemia with Hgb dropping to 6.7 ---transfused --- has high RDW (23) with nl MCV at 87 --- was taking iron at facility 6. Dementia ---apparently with behavior disturbance given that she is taking Risperdal ---Very advanced per family 7. Essential HTN 8. DM2 9. DJD 10.  Prior Left hip fx with repair2013 11. Constipation 12.  Anxiety (was on Ativan at facility)  13. Vitamin D Deficiency 14.  Moderate Malnutrition ---with recent significant but unknown degree of wt loss along with anorexia due to dementia 15.  Mild decompensated diastolic HF in setting of AF RVR         See top of note for PLAN   More than 50% of the visit was spent in counseling/coordination of care: Yes  Time Spent: 70 minutes

## 2015-07-11 NOTE — Progress Notes (Signed)
ANTIBIOTIC CONSULT NOTE - INITIAL  Pharmacy Consult for Zosyn/vancomycin Indication: rule out sepsis  Allergies  Allergen Reactions  . Sulfa Antibiotics Anaphylaxis  . Benzodiazepines Other (See Comments)    Reaction:  Unknown   . Oxycodone-Acetaminophen Other (See Comments)    Reaction:  Unknown     Patient Measurements: Height: 5\' 3"  (160 cm) Weight: 111 lb (50.349 kg) IBW/kg (Calculated) : 52.4 Adjusted Body Weight: 52.2 kg  Vital Signs: Temp: 98 F (36.7 C) (09/01 1200) Temp Source: Axillary (09/01 1200) BP: 146/77 mmHg (09/01 1300) Pulse Rate: 130 (09/01 1300) Intake/Output from previous day: 08/31 0701 - 09/01 0700 In: 1159.8 [I.V.:239.8; Blood:720; IV Piggyback:200] Out: 1100 [Urine:1100] Intake/Output from this shift: Total I/O In: 300 [I.V.:150; IV Piggyback:150] Out: 240 [Urine:240]  Labs:  Recent Labs  07/10/15 0102 07/10/15 1943 07/11/15 0740  WBC 26.6* 25.0* 18.8*  HGB 7.0* 6.7* 9.0*  PLT 408 465* 368  CREATININE 0.98 0.92 0.97   Estimated Creatinine Clearance: 36.1 mL/min (by C-G formula based on Cr of 0.97). No results for input(s): VANCOTROUGH, VANCOPEAK, VANCORANDOM, GENTTROUGH, GENTPEAK, GENTRANDOM, TOBRATROUGH, TOBRAPEAK, TOBRARND, AMIKACINPEAK, AMIKACINTROU, AMIKACIN in the last 72 hours.   Microbiology: Recent Results (from the past 720 hour(s))  Blood Culture (routine x 2)     Status: None (Preliminary result)   Collection Time: 07/09/15  8:05 PM  Result Value Ref Range Status   Specimen Description BLOOD BLOOD RIGHT FOREARM  Final   Special Requests BOTTLES DRAWN AEROBIC AND ANAEROBIC  Final   Culture NO GROWTH 2 DAYS  Final   Report Status PENDING  Incomplete  Urine culture     Status: None   Collection Time: 07/09/15  8:05 PM  Result Value Ref Range Status   Specimen Description URINE, CLEAN CATCH  Final   Special Requests NONE  Final   Culture MULTIPLE SPECIES PRESENT, SUGGEST RECOLLECTION  Final   Report Status 07/11/2015  FINAL  Final  Blood Culture (routine x 2)     Status: None (Preliminary result)   Collection Time: 07/09/15  8:08 PM  Result Value Ref Range Status   Specimen Description BLOOD BLOOD RIGHT FOREARM  Final   Special Requests BOTTLES DRAWN AEROBIC AND ANAEROBIC  Final   Culture NO GROWTH 2 DAYS  Final   Report Status PENDING  Incomplete  MRSA PCR Screening     Status: None   Collection Time: 07/10/15  1:10 AM  Result Value Ref Range Status   MRSA by PCR NEGATIVE NEGATIVE Final    Comment:        The GeneXpert MRSA Assay (FDA approved for NASAL specimens only), is one component of a comprehensive MRSA colonization surveillance program. It is not intended to diagnose MRSA infection nor to guide or monitor treatment for MRSA infections.     Medical History: Past Medical History  Diagnosis Date  . Hypertension   . Diabetes mellitus without complication   . Dementia   . Arthritis   . History of echocardiogram     a. echo 03/2015: EF 55-60%, LA/RA mildly dilated, no AI  . Chronic atrial fibrillation     a. no on long term full dose anticoagulation    Medications:  Infusions:  . sodium chloride 75 mL/hr at 07/11/15 9147   Assessment: Madison Montoya cc SOB at nursing facility sent to ED for work up found leukocytosis and tachycardia, code sepsis initiated.  Goal of Therapy:  Vancomycin trough level 15-20 mcg/ml  Plan:  Expected duration 7  days with resolution of temperature and/or normalization of WBC. Will continue Zosyn 3.375 gm IV Q8H EI and vancomycin 750 mg IV Q24H without stacked dosing since "loaded" in ED, will check trough before fourth dose and adjust as necessary to maintain trough 15 to 20 mcg/mL.  Luisa Hart D, Pharm.D. Clinical Pharmacist 07/11/2015,1:16 PM

## 2015-07-11 NOTE — Progress Notes (Signed)
Inpatient Diabetes Program Recommendations  AACE/ADA: New Consensus Statement on Inpatient Glycemic Control (2013)  Target Ranges:  Prepandial:   less than 140 mg/dL      Peak postprandial:   less than 180 mg/dL (1-2 hours)      Critically ill patients:  140 - 180 mg/dL   Results for Madison Montoya, Madison Montoya (MRN [id number]) as of 07/11/2015 08:59  Ref. Range 07/09/2015 20:05 07/10/2015 01:02 07/10/2015 19:43 07/11/2015 07:40  Glucose Latest Ref Range: 65-99 mg/dL 409 (H) 811 (H) 914 (H) 156 (H)    Diabetes history: DM2 Outpatient Diabetes medications: None Current orders for Inpatient glycemic control: None  Inpatient Diabetes Program Recommendations Correction (SSI): Patient has a history of DM2. Please order CBGs with Novolog correction scale ACHS. HgbA1C: Last A1C in the chart was 6.0% on 03/16/15. Please consider ordering an A1C to evaluate glycemic control over the past 2-3 months.  Thanks, Orlando Penner, RN, MSN, CCRN, CDE Diabetes Coordinator Inpatient Diabetes Program [phone] (Team Pager from 8am to 5pm) [phone] ([facility] office) [phone] [facility] office) [phone] [facility] office)

## 2015-07-11 NOTE — Progress Notes (Signed)
Pt slightly lethargic.  Discussed w/Dr Anne Hahn if IV benadryl should be given prior transfusion due to additional sedation.  MD states to hold at this time and only give tylenol suppository pre-transfusion.

## 2015-07-11 NOTE — Progress Notes (Signed)
   07/11/15 0957  Clinical Encounter Type  Visited With Patient;Patient and family together  Visit Type Initial  Consult/Referral To Chaplain  Spiritual Encounters  Spiritual Needs Emotional  Stress Factors  Patient Stress Factors None identified  Family Stress Factors None identified  Chaplain rounded in the unit and offered a compassionate presence and support as applicable. Family asked to keep patient in prayer. Chaplain Audon Heymann A. Raylen Ken Ext. [phone]

## 2015-07-11 NOTE — Progress Notes (Signed)
[facility] - [place] [facility]   PATIENT NAME: Madison Montoya    MR#:  [id number]  DATE OF BIRTH:  Mar 09, 1932  SUBJECTIVE:  CHIEF COMPLAINT:   Chief Complaint  Patient presents with  . Shortness of Breath   admitted for sepsis with UTI.  Patient is drowsy  Poor appetite  Family at bedside Transfer to CCU overnight for atrial fibrillation with rapid ventricular rate. Was placed on a Cardizem drip and presently off the drip. Received 2 units packed RBC overnight REVIEW OF SYSTEMS:    Review of Systems  Unable to perform ROS: dementia      DRUG ALLERGIES:   Allergies  Allergen Reactions  . Sulfa Antibiotics Anaphylaxis  . Benzodiazepines Other (See Comments)    Reaction:  Unknown   . Oxycodone-Acetaminophen Other (See Comments)    Reaction:  Unknown     VITALS:  Blood pressure 115/61, pulse 73, temperature 97.7 F (36.5 C), temperature source Axillary, resp. rate 21, height  (1.6 m), weight 50.349 kg (111 lb), SpO2 100 %.  PHYSICAL EXAMINATION:   Physical Exam  GENERAL:  81 y.o.-year-old patient lying in the bed with no acute distress. Thin critically ill EYES: Pupils equal, round, reactive to light and accommodation. No scleral icterus. Extraocular muscles intact.  HEENT: Head atraumatic, normocephalic. Oropharynx and nasopharynx clear.  NECK:  Supple, no jugular venous distention. No thyroid enlargement, no tenderness.  LUNGS: Normal breath sounds bilaterally, no wheezing, rales, rhonchi. No use of accessory muscles of respiration.  CARDIOVASCULAR: S1, S2 normal. No murmurs, rubs, or gallops.  ABDOMEN: Soft, nontender, nondistended. Bowel sounds present. No organomegaly or mass.  EXTREMITIES: No cyanosis, clubbing or edema b/l.    NEUROLOGIC: Cranial nerves II through XII are intact. No focal Motor or sensory deficits b/l.   PSYCHIATRIC: The patient is drowsy SKIN: No obvious rash, lesion, or ulcer.    LABORATORY PANEL:    CBC  Recent Labs Lab 07/11/15 0740  WBC 18.8*  HGB 9.0*  HCT 28.3*  PLT 368   ------------------------------------------------------------------------------------------------------------------  Chemistries   Recent Labs Lab 07/09/15 2005  07/10/15 1943 07/11/15 0740  NA 137  < > 137 137  K 3.8  < > 4.1 3.9  CL 105  < > 111 110  CO2 23  < > 17* 22  GLUCOSE 202*  < > 200* 156*  BUN 37*  < > 29* 26*  CREATININE 1.03*  < > 0.92 0.97  CALCIUM 9.1  < > 8.5* 8.4*  MG  --   --  1.7  --   AST 26  --   --   --   ALT 17  --   --   --   ALKPHOS 94  --   --   --   BILITOT 0.8  --   --   --   < > = values in this interval not displayed. ------------------------------------------------------------------------------------------------------------------  Cardiac Enzymes  Recent Labs Lab 07/11/15 0740  TROPONINI 0.09*   ------------------------------------------------------------------------------------------------------------------  RADIOLOGY:  Ct Head Wo Contrast  07/09/2015   CLINICAL DATA:  Altered mental status  EXAM: CT HEAD WITHOUT CONTRAST  TECHNIQUE: Contiguous axial images were obtained from the base of the skull through the vertex without intravenous contrast.  COMPARISON:  None.  FINDINGS: Mild cortical volume loss noted with proportional ventricular prominence. Areas of periventricular white matter hypodensity are most compatible with small vessel ischemic change. No acute hemorrhage, infarct, or mass lesion is identified. CSF density  bilateral remote basal ganglial lacunar infarcts are noted. No skull fracture. Orbits and paranasal sinuses are unremarkable.  IMPRESSION: Chronic findings as above, no acute intracranial abnormality.   Electronically Signed   By: Christiana Pellant M.D.   On: 07/09/2015 22:51   Dg Chest Port 1 View  07/10/2015   CLINICAL DATA:  Acute onset of shortness of breath. Initial encounter.  EXAM: PORTABLE CHEST - 1 VIEW  COMPARISON:  Chest  radiograph performed 07/09/2015  FINDINGS: The lungs are well-aerated. Vascular congestion is noted. Peribronchial thickening is seen. Mild bilateral atelectasis is noted. There is no evidence of pleural effusion or pneumothorax.  The cardiomediastinal silhouette is mildly enlarged. No acute osseous abnormalities are seen.  IMPRESSION: Vascular congestion and mild cardiomegaly. Peribronchial thickening seen. Mild bilateral atelectasis noted.   Electronically Signed   By: Roanna Raider M.D.   On: 07/10/2015 18:32   Dg Chest Port 1 View  07/09/2015   CLINICAL DATA:  Acute onset of sepsis. Shortness of breath. Initial encounter.  EXAM: PORTABLE CHEST - 1 VIEW  COMPARISON:  Chest radiograph performed 05/08/2015  FINDINGS: The lungs are well-aerated. Peribronchial thickening is noted. Mild bibasilar atelectasis is noted. There is no evidence of pleural effusion or pneumothorax.  The cardiomediastinal silhouette is borderline normal in size. No acute osseous abnormalities are seen.  IMPRESSION: Peribronchial thickening noted.  Mild bibasilar atelectasis seen.   Electronically Signed   By: Roanna Raider M.D.   On: 07/09/2015 20:41     ASSESSMENT AND PLAN:   * Sepsis due to urinary tract infection. On broad-spectrum antibiotics at this time. We will wait for final culture results. Acute encephalopathy due to UTI. IV fluids.  * Atrial fibrillation with rapid ventricular rate Unable to take by mouth Cardizem. We will place on IV Cardizem when necessary. Cardiology consult..  * Hypertension Home medications  * Diabetes mellitus Sliding scale insulin  * Dementia Watch for inpatient delirium  * Malnutrition Nutritional supplements when more awake to eat  * Anemia of chronic disease Status post transfusion of 2 units packed RBC. Improved. Monitor.  All the records are reviewed and case discussed with Care Management/Social Workerr. Management plans discussed with the patient, family and they are  in agreement.  CODE STATUS: DNR  DVT Prophylaxis: SCDs  TOTAL TIME TAKING CARE OF THIS PATIENT: 35 minutes.   POSSIBLE D/C IN 2-3 DAYS, DEPENDING ON CLINICAL CONDITION.   Milagros Loll R M.D on 07/11/2015 at 12:23 PM  Between 7am to 6pm - Pager - [phone]  After 6pm go to [email or web address] - password [email or web address] [facility]  [facility]  Office  [phone]  CC: Primary care physician; Tillman Abide, MD

## 2015-07-11 NOTE — Progress Notes (Signed)
Pt continues to be very anxious after receving 0.5mg  Ativan at 2111. Dr. Anne Hahn notified, acknowledged, & will place new order. Will continue to assess.

## 2015-07-11 NOTE — Progress Notes (Signed)
Pt extremely restless and agitated called dr. Elpidio Anis, order entered for  haldol q8 prn

## 2015-07-11 NOTE — Consult Note (Signed)
Cardiology Consultation Note  Patient ID: Madison Montoya, MRN: [id number], DOB/AGE: 1932/04/05 81 y.o. Admit date: 07/09/2015   Date of Consult: 07/11/2015 Primary Physician: Tillman Abide, MD Primary Cardiologist: Dr. Mariah Milling, MD  Chief Complaint: SOB Reason for Consult: Afib with RVR  HPI: 81 year old female with chronic Afib previously on warfarin, now off secondary to fall risk and GI bleed in early summer, dementia, DM2, HTN, and left hip fracture in 2013 who was recently admitted to [facility] in May of 2016 for UTI and Afib with RVR presents again with increased SOB was found to have UTI and Afib with RVR.   She was recently admitted in the spring of 2016 for UTI found to have Afib with RVR. She was successfully treated with Levaquin for UTI and Cardizem, metoprolol, and digoxin for Afib. She was continued on her longstanding Coumadin initially while inpatient, though developed GI bleed and this was stopped. HGB was stable and there were no plans for urgent colonoscopy or EGD per GI. She has remained off Coumadin since. Echo showed EF 55-60%, not sufficient to allow for LV diastolic function, no AI, mild biatrial enlargement. In hospital follow up she was continued on Cardizem 120 mg daily and Lopressor 25 mg bid along with aspirin 81 mg.   She presented to [facility] on 8/30 with complaints of increased SOB from her nursing facility. She was found to have marked leukocytosis of 29.2 and was ultimately found to have sepsis secondary to a UTI. Initial CXR showed mild bibasilar atelectasis. Head without acute findings. She was also noted to be in Afib with RVR in the 1-teens upon admission that responded to IV fluids with heart rate subsequently going into the 150s on 8/31 requiring Cardizem injection. Follow up CXR showed vascular congestion with continued atelectasis. Troponin throughout the course has been mildly elevated and flat trending. HGB 8.4 upon admission-->6.7 on 8/31. Mg++ 1.7. Today, heart rate  is rate controlled in the 80s to 90s. She is sleepy this morning, wanting to sleep. No complaints.    Past Medical History  Diagnosis Date  . Hypertension   . Diabetes mellitus without complication   . Dementia   . Arthritis   . History of echocardiogram     a. echo 03/2015: EF 55-60%, LA/RA mildly dilated, no AI  . Chronic atrial fibrillation     a. no on long term full dose anticoagulation      Most Recent Cardiac Studies: Echo 03/17/2015:  Study Conclusions  - Left ventricle: The cavity size was normal. There was mild concentric hypertrophy. Systolic function was normal. The estimated ejection fraction was in the range of 55% to 60%. The study is not technically sufficient to allow evaluation of LV diastolic function. - Aortic valve: There was no regurgitation. - Left atrium: The atrium was mildly dilated. - Right atrium: The atrium was mildly dilated.  Impressions:  - limited incomplete study. No color or tissue doppler. They study could not be completeted due to active chest pain.   Surgical History:  Past Surgical History  Procedure Laterality Date  . Fracture surgery    . Cardiac catheterization      [facility]     Home Meds: Prior to Admission medications   Medication Sig Start Date End Date Taking? Authorizing Provider  acetaminophen (TYLENOL) 500 MG tablet Take 500-1,000 mg by mouth 2 (two) times daily as needed for mild pain.    Yes Historical Provider, MD  acetaminophen (TYLENOL) 650 MG CR tablet  Take 650 mg by mouth 3 (three) times daily as needed for pain.   Yes Historical Provider, MD  aspirin EC 81 MG tablet Take 81 mg by mouth daily.    Yes Historical Provider, MD  diltiazem (CARDIZEM CD) 120 MG 24 hr capsule Take 1 capsule (120 mg total) by mouth at bedtime. 03/25/15  Yes Enedina Finner, MD  DULoxetine (CYMBALTA) 30 MG capsule Take 30 mg by mouth 2 (two) times daily.   Yes Historical Provider, MD  feeding supplement, ENSURE ENLIVE, (ENSURE ENLIVE) LIQD  Take 237 mLs by mouth 3 (three) times daily with meals. Patient taking differently: Take 237 mLs by mouth 2 (two) times daily between meals.  03/25/15  Yes Enedina Finner, MD  ferrous sulfate 325 (65 FE) MG tablet Take 325 mg by mouth daily.   Yes Historical Provider, MD  LORazepam (ATIVAN) 0.5 MG tablet Take 0.5 mg by mouth 3 (three) times daily as needed for anxiety.   Yes Historical Provider, MD  memantine (NAMENDA) 10 MG tablet Take 10 mg by mouth 2 (two) times daily.   Yes Historical Provider, MD  metoprolol tartrate (LOPRESSOR) 25 MG tablet Take 25 mg by mouth 2 (two) times daily.   Yes Historical Provider, MD  polyethylene glycol (MIRALAX / GLYCOLAX) packet Take 17 g by mouth every evening.   Yes Historical Provider, MD  PRESCRIPTION MEDICATION Apply 1 mL topically 3 (three) times daily as needed (for anxiety). Pt uses Lorazepam 0.5mg /mL topically.   Yes Historical Provider, MD  risperiDONE (RISPERDAL) 1 MG tablet Take 1 mg by mouth 2 (two) times daily.   Yes Historical Provider, MD  senna-docusate (SENOKOT-S) 8.6-50 MG per tablet Take 2 tablets by mouth daily.   Yes Historical Provider, MD  Vitamin D, Ergocalciferol, (DRISDOL) 50000 UNITS CAPS capsule Take 50,000 Units by mouth every 30 (thirty) days. Pt takes on the 17th of every month.   Yes Historical Provider, MD  LORazepam (ATIVAN) 1 MG tablet Take 1 tablet (1 mg total) by mouth every 8 (eight) hours as needed for anxiety. Patient not taking: Reported on 07/09/2015 05/08/15 05/07/16  Emily Filbert, MD    Inpatient Medications:  . aspirin EC  81 mg Oral Daily  . diltiazem  10 mg Intravenous STAT  . diphenhydrAMINE  25 mg Intravenous Once  . DULoxetine  30 mg Oral BID  . feeding supplement (ENSURE ENLIVE)  237 mL Oral BID BM  . ferrous sulfate  325 mg Oral Daily  . Influenza vac split quadrivalent PF  0.5 mL Intramuscular Tomorrow-1000  . memantine  10 mg Oral BID  . metoprolol tartrate  25 mg Oral BID  . piperacillin-tazobactam  (ZOSYN)  IV  3.375 g Intravenous 3 times per day  . polyethylene glycol  17 g Oral QPM  . risperiDONE  1 mg Oral BID  . senna-docusate  2 tablet Oral Daily  . sodium chloride  3 mL Intravenous Q12H  . vancomycin  750 mg Intravenous Q24H   . sodium chloride 75 mL/hr at 07/11/15 0614  . diltiazem (CARDIZEM) infusion Stopped (07/10/15 2341)    Allergies:  Allergies  Allergen Reactions  . Sulfa Antibiotics Anaphylaxis  . Benzodiazepines Other (See Comments)    Reaction:  Unknown   . Oxycodone-Acetaminophen Other (See Comments)    Reaction:  Unknown     Social History   Social History  . Marital Status: Single    Spouse Name: N/A  . Number of Children: N/A  . Years of Education:  N/A   Occupational History  . Not on file.   Social History Main Topics  . Smoking status: Never Smoker   . Smokeless tobacco: Not on file  . Alcohol Use: No  . Drug Use: Not on file  . Sexual Activity: Not on file   Other Topics Concern  . Not on file   Social History Narrative     Family History  Problem Relation Age of Onset  . Diabetes Neg Hx      Review of Systems: Review of Systems  Unable to perform ROS: other  Sleeping and did not want to wake  Labs:  Recent Labs  07/09/15 2005 07/10/15 0102 07/10/15 0715 07/10/15 1943  TROPONINI 0.10* 0.10* 0.11* 0.09*   Lab Results  Component Value Date   WBC 25.0* 07/10/2015   HGB 6.7* 07/10/2015   HCT 22.0* 07/10/2015   MCV 80.9 07/10/2015   PLT 465* 07/10/2015    Recent Labs Lab 07/09/15 2005  07/10/15 1943  NA 137  < > 137  K 3.8  < > 4.1  CL 105  < > 111  CO2 23  < > 17*  BUN 37*  < > 29*  CREATININE 1.03*  < > 0.92  CALCIUM 9.1  < > 8.5*  PROT 6.1*  --   --   BILITOT 0.8  --   --   ALKPHOS 94  --   --   ALT 17  --   --   AST 26  --   --   GLUCOSE 202*  < > 200*  < > = values in this interval not displayed. No results found for: CHOL, HDL, LDLCALC, TRIG No results found for: DDIMER  Radiology/Studies:  Ct  Head Wo Contrast  07/09/2015   CLINICAL DATA:  Altered mental status  EXAM: CT HEAD WITHOUT CONTRAST  TECHNIQUE: Contiguous axial images were obtained from the base of the skull through the vertex without intravenous contrast.  COMPARISON:  None.  FINDINGS: Mild cortical volume loss noted with proportional ventricular prominence. Areas of periventricular white matter hypodensity are most compatible with small vessel ischemic change. No acute hemorrhage, infarct, or mass lesion is identified. CSF density bilateral remote basal ganglial lacunar infarcts are noted. No skull fracture. Orbits and paranasal sinuses are unremarkable.  IMPRESSION: Chronic findings as above, no acute intracranial abnormality.   Electronically Signed   By: Christiana Pellant M.D.   On: 07/09/2015 22:51   Dg Chest Port 1 View  07/10/2015   CLINICAL DATA:  Acute onset of shortness of breath. Initial encounter.  EXAM: PORTABLE CHEST - 1 VIEW  COMPARISON:  Chest radiograph performed 07/09/2015  FINDINGS: The lungs are well-aerated. Vascular congestion is noted. Peribronchial thickening is seen. Mild bilateral atelectasis is noted. There is no evidence of pleural effusion or pneumothorax.  The cardiomediastinal silhouette is mildly enlarged. No acute osseous abnormalities are seen.  IMPRESSION: Vascular congestion and mild cardiomegaly. Peribronchial thickening seen. Mild bilateral atelectasis noted.   Electronically Signed   By: Roanna Raider M.D.   On: 07/10/2015 18:32   Dg Chest Port 1 View  07/09/2015   CLINICAL DATA:  Acute onset of sepsis. Shortness of breath. Initial encounter.  EXAM: PORTABLE CHEST - 1 VIEW  COMPARISON:  Chest radiograph performed 05/08/2015  FINDINGS: The lungs are well-aerated. Peribronchial thickening is noted. Mild bibasilar atelectasis is noted. There is no evidence of pleural effusion or pneumothorax.  The cardiomediastinal silhouette is borderline normal in size. No acute osseous  abnormalities are seen.   IMPRESSION: Peribronchial thickening noted.  Mild bibasilar atelectasis seen.   Electronically Signed   By: Roanna Raider M.D.   On: 07/09/2015 20:41    EKG: ECG 8/30: Afib with RVR, 115 bpm, nonspecific inferolateral st/t changes. ECG 8/31: Afib with RVR, 146 bpm, no st/t changes.  Weights: Filed Weights   07/09/15 2006 07/10/15 0053  Weight: 115 lb (52.164 kg) 111 lb (50.349 kg)     Physical Exam: Blood pressure 115/61, pulse 73, temperature 97.7 F (36.5 C), temperature source Axillary, resp. rate 21, height 5\' 3"  (1.6 m), weight 111 lb (50.349 kg), SpO2 100 %. Body mass index is 19.67 kg/(m^2). General: Well developed, well nourished, in no acute distress. Head: Normocephalic, atraumatic, sclera non-icteric, no xanthomas, nares are without discharge.  Neck: Negative for carotid bruits. JVD not elevated. Lungs: Decreased breath sounds bilaterally. Breathing is unlabored. Heart: Irregularly-irregular, with S1 S2. No murmurs, rubs, or gallops appreciated. Abdomen: Soft, non-tender, non-distended with normoactive bowel sounds. No hepatomegaly. No rebound/guarding. No obvious abdominal masses. Msk:  Strength and tone appear normal for age. Extremities: No clubbing or cyanosis. No edema.  Distal pedal pulses are 2+ and equal bilaterally. Neuro: Alert and oriented X 3. No facial asymmetry. No focal deficit. Moves all extremities spontaneously. Psych:  Responds to questions appropriately with a normal affect.    Assessment and Plan:  81 year old female with chronic Afib previously on warfarin, now off secondary to fall risk and GI bleed in early summer, dementia, DM2, HTN, and left hip fracture in 2013 who was recently admitted to [facility] in May of 2016 for UTI and Afib with RVR presents again with increased SOB was found to have UTI and Afib with RVR.  1. Chronic Afib with RVR: -Rate controlled on diltiazem gtt -Transition to PO diltiazem 120 mg daily -BP slightly soft at this time  precluding usage of beta blocker in addition to CCB -Renal function ok, could use digoxin for added rate control if needed -Not on long term full dose anticoagulation since her last admission in the spring given increase fall risk and GIB -CHADSVASc at least 6 -At increaed risk of Afib with RVR until infection clears  2. Mild decompensated diastolic HF: -In the setting of Afib with RVR -Add Lasix 20 mg daily and assess for response, titrate as needed -Recent echo 03/2015 as above, no need to repeat at this time  3. UTI with sepsis: -On ABX per IM  4. DM: -Per IM   Signed, Eula Listen, PA-C Pager: [phone] 07/11/2015, 8:14 AM

## 2015-07-11 NOTE — Care Management Important Message (Signed)
Important Message  Patient Details  Name: Madison Montoya MRN: [id number] Date of Birth: [birth year]   Medicare Important Message Given:  Yes-second notification given    [name] 07/11/2015, 10:57 AM

## 2015-07-12 LAB — TYPE AND SCREEN
ABO/RH(D): A POS
Antibody Screen: NEGATIVE
UNIT DIVISION: 0
Unit division: 0

## 2015-07-12 MED ORDER — BISACODYL 10 MG RE SUPP: 10.0000 mg | Freq: Every day | RECTAL | Status: AC | PRN

## 2015-07-12 MED ORDER — PROCHLORPERAZINE 25 MG RE SUPP: 25.0000 mg | Freq: Three times a day (TID) | RECTAL | Status: AC | PRN

## 2015-07-12 MED ORDER — LORAZEPAM 1 MG PO TABS: 1.0000 mg | ORAL_TABLET | Freq: Four times a day (QID) | ORAL | Status: AC | PRN

## 2015-07-12 MED ORDER — MORPHINE SULFATE (CONCENTRATE) 10 MG /0.5 ML PO SOLN: 10.0000 mg | ORAL | Status: AC | PRN

## 2015-07-12 MED ORDER — MORPHINE SULFATE (PF) 4 MG/ML IV SOLN: 4.0000 mg | INTRAVENOUS | Status: DC | PRN

## 2015-07-12 MED ORDER — LORAZEPAM 2 MG/ML IJ SOLN: 2.0000 mg | INTRAMUSCULAR | Status: DC | PRN

## 2015-07-12 MED ORDER — ACETAMINOPHEN 650 MG RE SUPP: 650.0000 mg | Freq: Four times a day (QID) | RECTAL | Status: AC | PRN

## 2015-07-12 MED ORDER — MORPHINE SULFATE (PF) 4 MG/ML IV SOLN
3.0000 mg | Freq: Four times a day (QID) | INTRAVENOUS | Status: DC
  Administered 2015-07-12: 12:00:00 3 mg via INTRAVENOUS
  Filled 2015-07-12: qty 1

## 2015-07-12 MED ORDER — MORPHINE SULFATE (CONCENTRATE) 10 MG /0.5 ML PO SOLN: 5.0000 mg | Freq: Four times a day (QID) | ORAL | Status: AC

## 2015-07-12 NOTE — Plan of Care (Signed)
Problem: Discharge Progression Outcomes Goal: Barriers To Progression Addressed/Resolved Individualization: Pt prefers to be called Madison Montoya who lived at [facility].  Hx HTN, DM, Dementia, Arthritis which were previously controlled by home medications.  Pt now comfort care. High fall risk. Bed alarm on. Hourly rounding.  Goal: Other Discharge Outcomes/Goals Outcome: Progressing Plan of care progress to goals: 1. Pain controlled by scheduled & PRN Morphine. Very anxious when first arrived to floor, Ativan given with noted relief.  2. Foley in place with little output.  3. Family at bedside involved and show understanding of what to expect w/ comfort care.  Will continue to assess.

## 2015-07-12 NOTE — Progress Notes (Signed)
New referral for hospice home post palliative medicine consult on 81yo female who was admitted to [facility] on 8.30.16 with sepsis (r/t UTI) and atril fibrillation with RVR.  She has not responded to aggressive treatment and has continued to deteriorate.  Family has chosen to make patient comfort care.  Patient has past medical history of atril fib, DMII, and dementia. She is currently unresponsive and not taken in anything by mouth in the past 24 hours.  During the last 24 hours she has required management of the following symptoms:  Severe restlessness and anxiety, presumed severe pain.  Family has requested orders for routine morphine as they do not want her to wake and be in the state she was prior to symptom management- severe restlessness , pain with moaning and grimacing-  Per family request, Dr. Megan Salon has ordered routine morphine.   I met with the patient's family (3 sons, 1 daughter and 1 Granddaughter) to discuss hospice services and hospice home services.  After discussion, they wanted time to discuss amongst themselves and to also make sure another sister- who was not present, was on board.  They later agreed and consented to transport to the hospice home for end of life care.  Consents signed.  Patient is currently not on any oxygen and has a foley catheter.  Portable DNR will accompany patient to the hospice home.  EMS transport arranged by this RN for 1230 pick up.  Patient's RN Lizbeth Bark will call report to the hospice home admit line.  Updated referral and hospital information faxed to referral intake.  Thank you for allowing participation in this patient's care.  Dimas Aguas, RN Clinical Nurse Liaison [facility]

## 2015-07-12 NOTE — Clinical Social Work Note (Signed)
Patient to transport to the hospice home today as arranged by [place] with [facility]. CSW assisted with packet. York Spaniel MSW,LCSW [phone]

## 2015-07-12 NOTE — Progress Notes (Addendum)
Notify Dr Orvan Falconer that pt has a foley.MD verbalized to place an order to discharge pt with a foley.

## 2015-07-12 NOTE — Plan of Care (Signed)
Problem: Discharge Progression Outcomes Goal: Other Discharge Outcomes/Goals Outcome: Adequate for Discharge Pt respond to voice at times, calm. No signs of pain nor discomfort. No dyspnea noted. Scheduled IV morphine given. Pt to be discharge with a foley per Dr Orvan Falconer. Report given to [place] from [facility] home. Family at the bedside. Called EMS.

## 2015-07-12 NOTE — Progress Notes (Signed)
Nutrition Brief Note  Chart reviewed. Pt now transitioning to comfort care.  Pt now NPO. No further nutrition interventions warranted at this time.  Please re-consult as needed.   Leda Quail, [place], LDN Pager [phone]

## 2015-07-12 NOTE — Progress Notes (Signed)
Palliative Medicine Inpatient Consult Follow Up Note   Name: MEKENZIE MODESTE Date: 07/12/2015 MRN: [id number]  DOB: 12/28/1931  Referring Physician: Milagros Loll, MD  Palliative Care consult requested for this 81 y.o. female for goals of medical therapy in patient with sepsis, advanced dementia, AFib with RVR, and severe anxiety, restlessness, and apparent pain (with moaning and grimacing). Family requested comfort terminal care last night and this was ordered. It was thought that she might die during the night, but she is still with [place]. She is sedated with both morphine and ativan and resting at this time. She needed an increase in morphine for her to become comfortable as she was very restless (grimacing, moaning, talking nonsensibly, and grabbing at things in the air, etc) --requiring two family members holding her hands and comforting her constantly last evening.  Today, sons, Bethann Berkshire and Genevie Cheshire are in the room along with one granddaughter, April.     TODAY'S CONVERSATIONS, EVENTS, AND PLAN:  Family is in process of getting all 5 siblings 'on board' with Hospice Home plan.  She is DNR  Hospice Home Liaison nurse is coming to talk with family. Discharge Med Rec is being worked on in anticipation that the family will accept Hospice Home transfer.    PLEASE INCLUDE THE FOLLOWING INFORMATION IN THE  DISCHARGE SUMMARY FOR PT:  Note that patient has the following symptoms: Severe restlessness and anxiety Severe pain is presumed --- as pt has said 'yes' to pain questions  ----and she had grimacing and moaning and was extremely restless  ----symptoms were relieved with morphine ----pt needed an increase in morphine this am  ----Family strongly wants both the prn morphine and the routine doses of morphine --- b/c they do not want her to wake up b/c when she does, she has severe restlessness and pain with moaning and grimacing.  Additional information:  She has not eaten anything in over 24  hours per report. She does not drink anything and has not accepted offers of food or liquids in over 24 hours. The In/Out record shows no BM recorded since admission to hospital on 07/09/15 (3 days ago).  So constipation (with possible pain) is also a symptom requiring management.  Pt is actively dying.     REVIEW OF SYSTEMS:  Patient is not able to provide ROS due to critical illness, advanced dementia, and sedation.  CODE STATUS: DNR   PAST MEDICAL HISTORY: Past Medical History  Diagnosis Date  . Hypertension   . Diabetes mellitus without complication   . Dementia   . Arthritis   . History of echocardiogram     a. echo 03/2015: EF 55-60%, LA/RA mildly dilated, no AI  . Chronic atrial fibrillation     a. no on long term full dose anticoagulation    PAST SURGICAL HISTORY:  Past Surgical History  Procedure Laterality Date  . Fracture surgery    . Cardiac catheterization      [facility]    Vital Signs: BP 169/93 mmHg  Pulse 149  Temp(Src) 97.9 F (36.6 C) (Axillary)  Resp 41  Ht 5\' 3"  (1.6 m)  Wt 50.349 kg (111 lb)  BMI 19.67 kg/m2  SpO2 96% Filed Weights   07/09/15 2006 07/10/15 0053  Weight: 52.164 kg (115 lb) 50.349 kg (111 lb)    Estimated body mass index is 19.67 kg/(m^2) as calculated from the following:   Height as of this encounter: 5\' 3"  (1.6 m).   Weight as of this encounter: 50.349 kg (  111 lb).  PHYSICAL EXAM: She is sleeping due to sedation with morphine and ativan which was required routinely and prn.  The dose of morphine had to be increased this am and this has her sleeping well. Pale Trace JVD Heart irreg irreg with rate around 100 Lungs with ronchi but not rales Abd soft with faint BS Ext pale --no cyanosis or mottling noted but atrophy is evident in muscles.   LABS: CBC:    Component Value Date/Time   WBC 18.8* 07/11/2015 0740   WBC 12.0* 06/24/2013 1334   HGB 9.0* 07/11/2015 0740   HGB 13.3 06/24/2013 1334   HCT 28.3* 07/11/2015 0740   HCT  38.3 06/24/2013 1334   PLT 368 07/11/2015 0740   PLT 288 06/24/2013 1334   MCV 87.2 07/11/2015 0740   MCV 92 06/24/2013 1334   NEUTROABS 16.7* 07/11/2015 0740   NEUTROABS 6.5 10/18/2012 0726   LYMPHSABS 1.0 07/11/2015 0740   LYMPHSABS 2.1 10/18/2012 0726   MONOABS 1.0* 07/11/2015 0740   MONOABS 1.2* 10/18/2012 0726   EOSABS 0.1 07/11/2015 0740   EOSABS 0.1 10/18/2012 0726   BASOSABS 0.1 07/11/2015 0740   BASOSABS 0.0 10/18/2012 0726   Comprehensive Metabolic Panel:    Component Value Date/Time   NA 137 07/11/2015 0740   NA 138 06/24/2013 1334   K 3.9 07/11/2015 0740   K 4.5 06/24/2013 1334   CL 110 07/11/2015 0740   CL 107 06/24/2013 1334   CO2 22 07/11/2015 0740   CO2 26 06/24/2013 1334   BUN 26* 07/11/2015 0740   BUN 24* 06/24/2013 1334   CREATININE 0.97 07/11/2015 0740   CREATININE 0.82 06/24/2013 1334   GLUCOSE 156* 07/11/2015 0740   GLUCOSE 82 06/24/2013 1334   CALCIUM 8.4* 07/11/2015 0740   CALCIUM 9.4 06/24/2013 1334   AST 26 07/09/2015 2005   AST 29 06/24/2013 1334   ALT 17 07/09/2015 2005   ALT 18 06/24/2013 1334   ALKPHOS 94 07/09/2015 2005   ALKPHOS 85 06/24/2013 1334   BILITOT 0.8 07/09/2015 2005   BILITOT 0.4 06/24/2013 1334   PROT 6.1* 07/09/2015 2005   PROT 7.0 06/24/2013 1334   ALBUMIN 2.8* 07/09/2015 2005   ALBUMIN 3.5 06/24/2013 1334    IMPRESSION: 1. Sepsis due to UTI ---broad spectrum ABX and IVF ---multiple species in urine cx and bld cx is neg to date 2. Persistent Afib (perviously on coumadin but not in a few months)  ---NOT A CANDIDATE FOR ANTICOAGULATION DUE TO GI BLEEDING (May 2016). --- with RVR ---2 g Magnesium Sulfate given  ---Had echo 03/2015 showing EF 55-60% with LA and RA mildly dilated  2. Emphysema (seen on prior CT of a/p 03/16/15) 3. Diverticulosis on prior CT 4. Acute lower GI bleeding 5. Anemia with Hgb dropping to 6.7 ---transfused --- has high RDW (23) with nl MCV at 87 --- was taking iron at facility 6.  Dementia ---apparently with behavior disturbance given that she is taking Risperdal ---Very advanced and associated with decreased oral intake per family 7. Essential HTN 8. DM2 9. DJD 10. Prior Left hip fx with repair2013 11. Constipation 12. Anxiety (was on Ativan at facility)  13. Vitamin D Deficiency 14. Moderate Malnutrition ---with recent significant but unknown degree of wt loss along with anorexia due to dementia 15. Mild decompensated diastolic HF in setting of AF RVR  See top of note for PLAN  REFERRALS TO BE ORDERED:  Hospice Home Liaison is consulted and will come and talk with  family  More than 50% of the visit was spent in counseling/coordination of care: YES  Time Spent:  60 min

## 2015-07-12 NOTE — Discharge Instructions (Signed)
Per hospice home

## 2015-07-12 NOTE — Discharge Summary (Signed)
[facility] - [place] at [facility]   PATIENT NAME: Madison Montoya    MR#:  [id number]  DATE OF BIRTH:  20-Mar-1932  DATE OF ADMISSION:  07/09/2015 ADMITTING PHYSICIAN: Wyatt Haste, MD  DATE OF DISCHARGE: No discharge date for patient encounter.  PRIMARY CARE PHYSICIAN: Tillman Abide, MD    ADMISSION DIAGNOSIS:  SIRS (systemic inflammatory response syndrome) [A41.9]  DISCHARGE DIAGNOSIS:  Principal Problem:   Sepsis Active Problems:   Protein-calorie malnutrition, severe   SECONDARY DIAGNOSIS:   Past Medical History  Diagnosis Date  . Hypertension   . Diabetes mellitus without complication   . Dementia   . Arthritis   . History of echocardiogram     a. echo 03/2015: EF 55-60%, LA/RA mildly dilated, no AI  . Chronic atrial fibrillation     a. no on long term full dose anticoagulation     ADMITTING HISTORY  Madison Montoya is a 81 y.o. female with a known history of chronic atrial fibrillation, type 2 diabetes uncomplicated, dementia presenting with shortness of breath. She is unable to provide any meaningful information given mental status at baseline however she apparently stated that she was short of breath at her nursing facility and sent to Hospital further workup and evaluation. Once again she is unable to provide meaningful information given mental status/medical condition. Emergency department course: Found to have markedly leukocytosis as well as be tachycardic concern for sepsis, code sepsis initiated   HOSPITAL COURSE:   Patient was aggressively treated with IV antibiotics. Was also transferred to CCU placed on a Cardizem drip due to atrial fibrillation with rapid ventricular rate. With no response to aggressive treatment of sepsis due to urinary tract infection and deteriorating condition as outpatient patient was thought to be fitted for comfort measures by family. Dr. Orvan Falconer palliative care was counseled at. She was placed on  comfort measures and is being transferred to hospice home for end-of-life care.  Per Dr. Jonelle Sidle INCLUDE THE FOLLOWING INFORMATION IN THE DISCHARGE SUMMARY FOR PT:  Note that patient has the following symptoms: Severe restlessness and anxiety Severe pain is presumed --- as pt has said 'yes' to pain questions  ----and she had grimacing and moaning and was extremely restless  ----symptoms were relieved with morphine ----pt needed an increase in morphine this am  ----Family strongly wants both the prn morphine and the routine doses of morphine --- b/c they do not want her to wake up b/c when she does, she has severe restlessness and pain with moaning and grimacing.  Additional information:  She has not eaten anything in over 24 hours per report. She does not drink anything and has not accepted offers of food or liquids in over 24 hours. The In/Out record shows no BM recorded since admission to hospital on 07/09/15 (3 days ago). So constipation (with possible pain) is also a symptom requiring management.  Pt is actively dying.    CONSULTS OBTAINED:  Treatment Team:  Marykay Lex, MD  DRUG ALLERGIES:   Allergies  Allergen Reactions  . Sulfa Antibiotics Anaphylaxis    DISCHARGE MEDICATIONS:   Current Discharge Medication List    START taking these medications   Details  acetaminophen (TYLENOL) 650 MG suppository Place 1 suppository (650 mg total) rectally every 6 (six) hours as needed for mild pain (or Fever >/= 101). Qty: 12 suppository, Refills: 0    bisacodyl (DULCOLAX) 10 MG suppository Place 1 suppository (10 mg total) rectally daily as needed for  moderate constipation. Qty: 12 suppository, Refills: 0    !! Morphine Sulfate (MORPHINE CONCENTRATE) 10 mg / 0.5 ml concentrated solution Take 0.25 mLs (5 mg total) by mouth every 6 (six) hours. Qty: 30 mL, Refills: 0    !! Morphine Sulfate (MORPHINE CONCENTRATE) 10 mg / 0.5 ml concentrated solution Take 0.5 mLs  (10 mg total) by mouth every 2 (two) hours as needed for severe pain or shortness of breath. Qty: 30 mL, Refills: 0    prochlorperazine (COMPAZINE) 25 MG suppository Place 1 suppository (25 mg total) rectally every 8 (eight) hours as needed for nausea or vomiting. Qty: 12 suppository, Refills: 0     !! - Potential duplicate medications found. Please discuss with provider.    CONTINUE these medications which have CHANGED   Details  LORazepam (ATIVAN) 1 MG tablet Take 1 tablet (1 mg total) by mouth every 6 (six) hours as needed for anxiety. Qty: 60 tablet, Refills: 0      STOP taking these medications     acetaminophen (TYLENOL) 500 MG tablet      acetaminophen (TYLENOL) 650 MG CR tablet      aspirin EC 81 MG tablet      diltiazem (CARDIZEM CD) 120 MG 24 hr capsule      DULoxetine (CYMBALTA) 30 MG capsule      feeding supplement, ENSURE ENLIVE, (ENSURE ENLIVE) LIQD      ferrous sulfate 325 (65 FE) MG tablet      memantine (NAMENDA) 10 MG tablet      metoprolol tartrate (LOPRESSOR) 25 MG tablet      polyethylene glycol (MIRALAX / GLYCOLAX) packet      PRESCRIPTION MEDICATION      risperiDONE (RISPERDAL) 1 MG tablet      senna-docusate (SENOKOT-S) 8.6-50 MG per tablet      Vitamin D, Ergocalciferol, (DRISDOL) 50000 UNITS CAPS capsule          Today    VITAL SIGNS:  Blood pressure 169/93, pulse 149, temperature 97.9 F (36.6 C), temperature source Axillary, resp. rate 41, height 5\' 3"  (1.6 m), weight 50.349 kg (111 lb), SpO2 96 %.  I/O:   Intake/Output Summary (Last 24 hours) at 07/12/15 1126 Last data filed at 07/11/15 1815  Gross per 24 hour  Intake 853.83 ml  Output      0 ml  Net 853.83 ml    PHYSICAL EXAMINATION:  Physical Exam  GENERAL:  81 y.o.-year-old patient lying in the bed drowsy Looks critically ill. Thin.  DATA REVIEW:   CBC  Recent Labs Lab 07/11/15 0740  WBC 18.8*  HGB 9.0*  HCT 28.3*  PLT 368    Chemistries   Recent  Labs Lab 07/09/15 2005  07/10/15 1943 07/11/15 0740  NA 137  < > 137 137  K 3.8  < > 4.1 3.9  CL 105  < > 111 110  CO2 23  < > 17* 22  GLUCOSE 202*  < > 200* 156*  BUN 37*  < > 29* 26*  CREATININE 1.03*  < > 0.92 0.97  CALCIUM 9.1  < > 8.5* 8.4*  MG  --   --  1.7  --   AST 26  --   --   --   ALT 17  --   --   --   ALKPHOS 94  --   --   --   BILITOT 0.8  --   --   --   < > =  values in this interval not displayed.  Cardiac Enzymes  Recent Labs Lab 07/11/15 0740  TROPONINI 0.09*    Microbiology Results  Results for orders placed or performed during the hospital encounter of 07/09/15  Blood Culture (routine x 2)     Status: None (Preliminary result)   Collection Time: 07/09/15  8:05 PM  Result Value Ref Range Status   Specimen Description BLOOD BLOOD RIGHT FOREARM  Final   Special Requests BOTTLES DRAWN AEROBIC AND ANAEROBIC  Final   Culture NO GROWTH 3 DAYS  Final   Report Status PENDING  Incomplete  Urine culture     Status: None   Collection Time: 07/09/15  8:05 PM  Result Value Ref Range Status   Specimen Description URINE, CLEAN CATCH  Final   Special Requests NONE  Final   Culture MULTIPLE SPECIES PRESENT, SUGGEST RECOLLECTION  Final   Report Status 07/11/2015 FINAL  Final  Blood Culture (routine x 2)     Status: None (Preliminary result)   Collection Time: 07/09/15  8:08 PM  Result Value Ref Range Status   Specimen Description BLOOD BLOOD RIGHT FOREARM  Final   Special Requests BOTTLES DRAWN AEROBIC AND ANAEROBIC  Final   Culture NO GROWTH 3 DAYS  Final   Report Status PENDING  Incomplete  MRSA PCR Screening     Status: None   Collection Time: 07/10/15  1:10 AM  Result Value Ref Range Status   MRSA by PCR NEGATIVE NEGATIVE Final    Comment:        The GeneXpert MRSA Assay (FDA approved for NASAL specimens only), is one component of a comprehensive MRSA colonization surveillance program. It is not intended to diagnose MRSA infection nor to guide  or monitor treatment for MRSA infections.     RADIOLOGY:  Dg Chest Port 1 View  07/10/2015   CLINICAL DATA:  Acute onset of shortness of breath. Initial encounter.  EXAM: PORTABLE CHEST - 1 VIEW  COMPARISON:  Chest radiograph performed 07/09/2015  FINDINGS: The lungs are well-aerated. Vascular congestion is noted. Peribronchial thickening is seen. Mild bilateral atelectasis is noted. There is no evidence of pleural effusion or pneumothorax.  The cardiomediastinal silhouette is mildly enlarged. No acute osseous abnormalities are seen.  IMPRESSION: Vascular congestion and mild cardiomegaly. Peribronchial thickening seen. Mild bilateral atelectasis noted.   Electronically Signed   By: Roanna Raider M.D.   On: 07/10/2015 18:32      Follow up with PCP in 1 week.  Management plans discussed with the patient, family and they are in agreement.  CODE STATUS:     Code Status Orders        Start     Ordered   07/10/15 1825  Do not attempt resuscitation (DNR)   Continuous    Question Answer Comment  In the event of cardiac or respiratory ARREST Do not call a "code blue"   In the event of cardiac or respiratory ARREST Do not perform Intubation, CPR, defibrillation or ACLS   In the event of cardiac or respiratory ARREST Use medication by any route, position, wound care, and other measures to relive pain and suffering. May use oxygen, suction and manual treatment of airway obstruction as needed for comfort.   Comments confirmed with her daughter in room.      07/10/15 1826      TOTAL TIME TAKING CARE OF THIS PATIENT ON DAY OF DISCHARGE: more than 30 minutes.    Alex Mcmanigal, [employer].D  on 07/12/2015 at 11:26 AM  Between 7am to 6pm - Pager - [phone]  After 6pm go to [email or web address] - password [email or web address] [facility]  [place] [facility]  Office  [phone]  CC: Primary care physician; Tillman Abide, MD

## 2015-07-14 LAB — CULTURE, BLOOD (ROUTINE X 2)
Culture: NO GROWTH
Culture: NO GROWTH

## 2015-07-16 ENCOUNTER — Telehealth: Payer: Self-pay | Admitting: Internal Medicine

## 2015-07-16 NOTE — Telephone Encounter (Signed)
Hospice called to let Dr.Letvak know patient passed away yesterday at 5:53pm.

## 2015-07-16 NOTE — Telephone Encounter (Signed)
Noted This was expected Was at hospice home

## 2015-08-10 DEATH — deceased

## 2016-04-16 IMAGING — CT CT HEAD W/O CM
1 series · 16 of 30 positions shown, 20 images · non-contrast
Comparison: None.

CLINICAL DATA: Altered mental status

EXAM:
CT HEAD WITHOUT CONTRAST
TECHNIQUE: Contiguous axial images were obtained from the base of the skull
through the vertex without intravenous contrast.

[Series 2: head wo · axial · 0.40mm/px · z∈[+316,+442]mm · 16 of 32 slices shown, 20 images]
[im 2/32  brain]
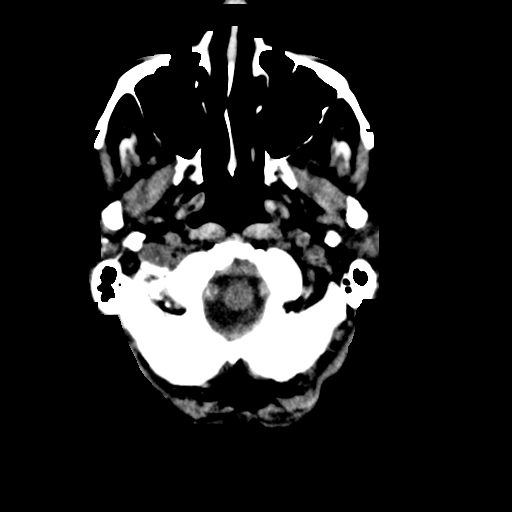
[im 2/32  bone]
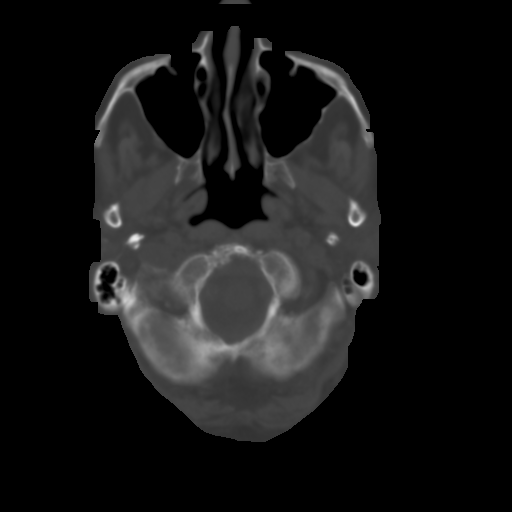
[im 4/32  brain]
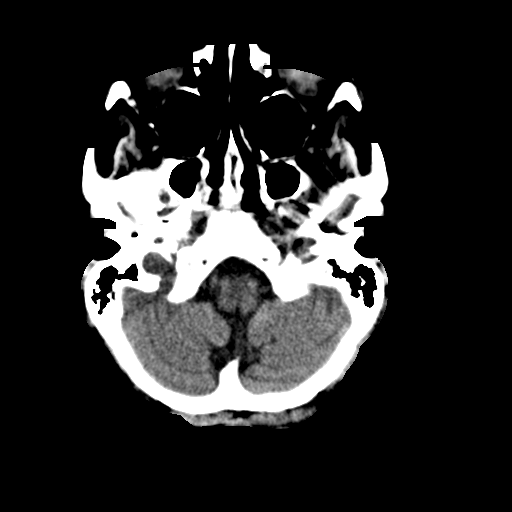
[im 6/32  brain]
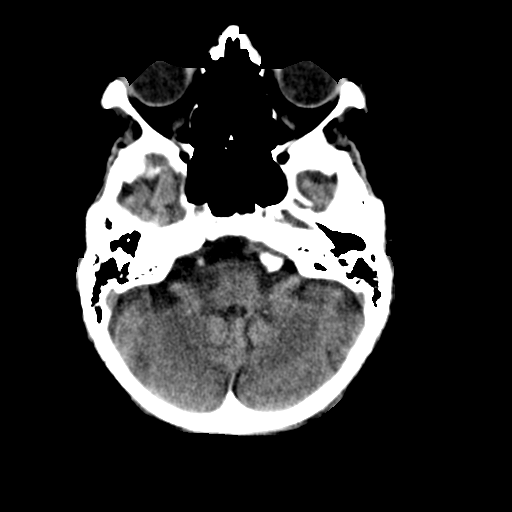
[im 8/32  brain]
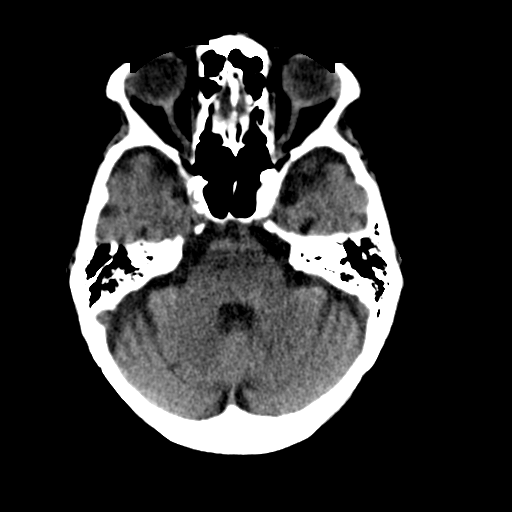
[im 9/32  brain]
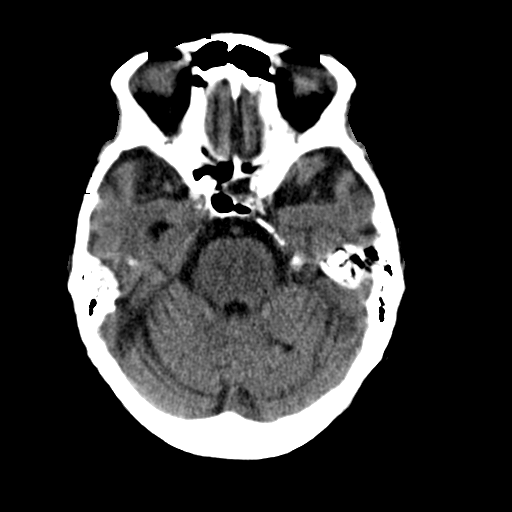
[im 9/32  bone]
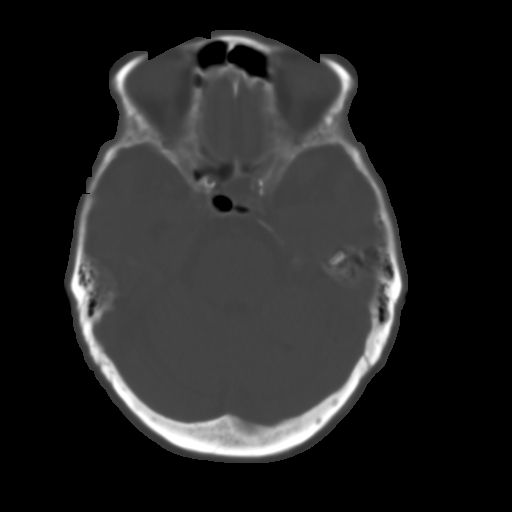
[im 11/32  brain]
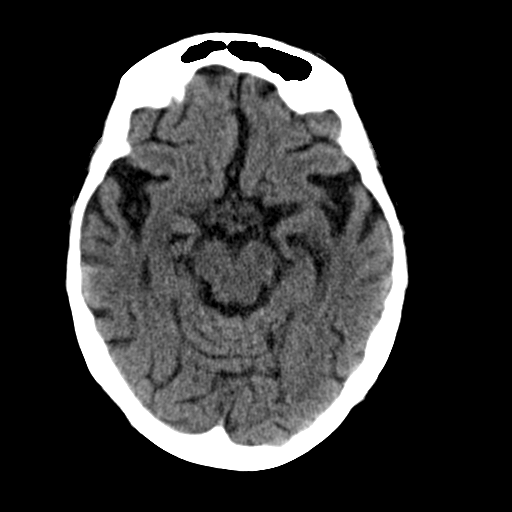
[im 13/32  brain]
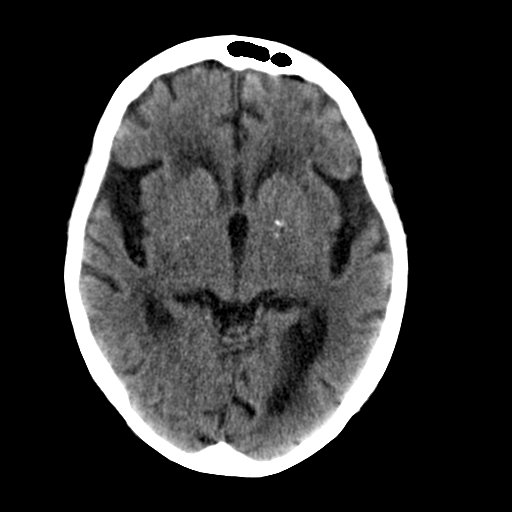
[im 15/32  brain]
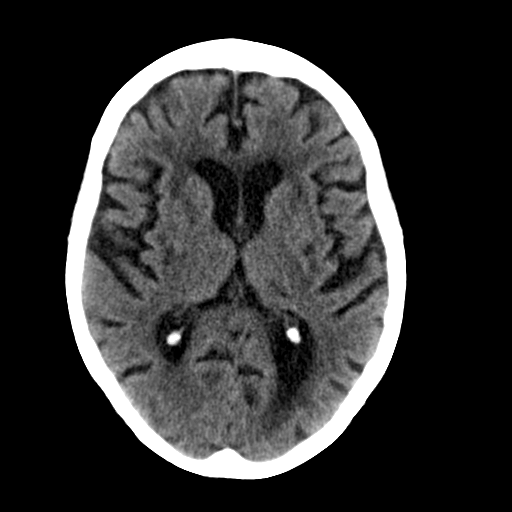
[im 17/32  brain]
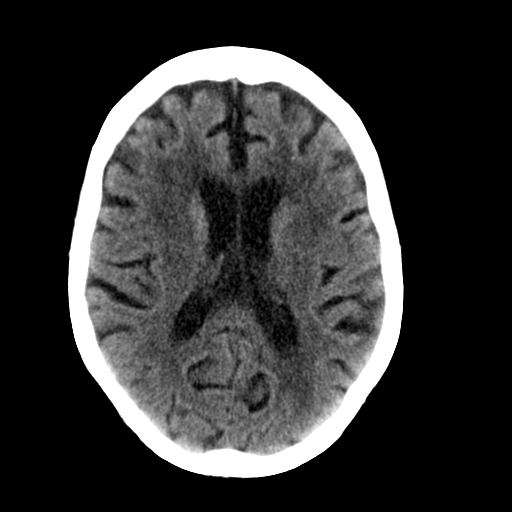
[im 17/32  bone]
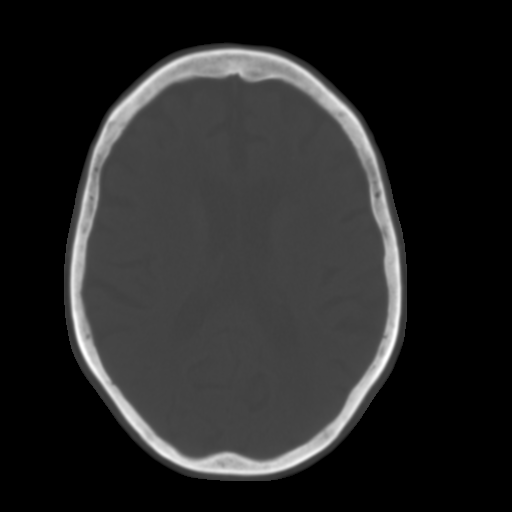
[im 19/32  brain]
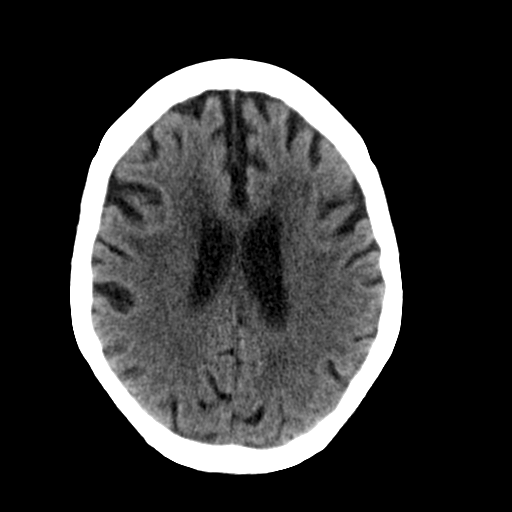
[im 21/32  brain]
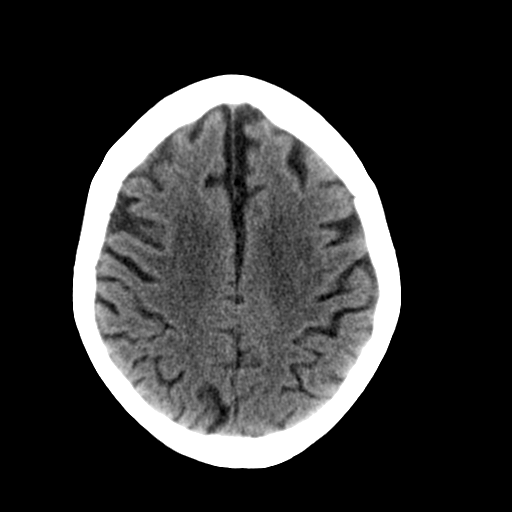
[im 23/32  brain]
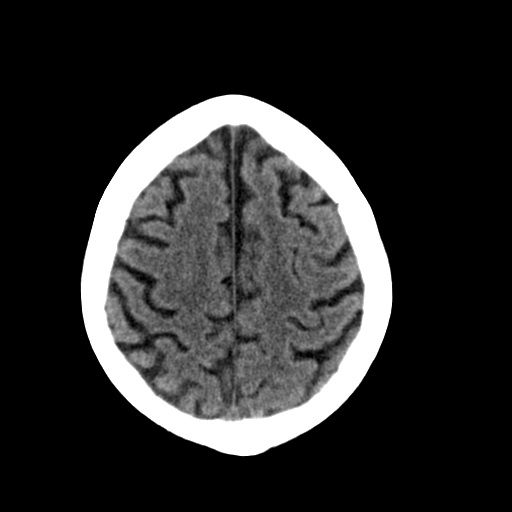
[im 24/32  brain]
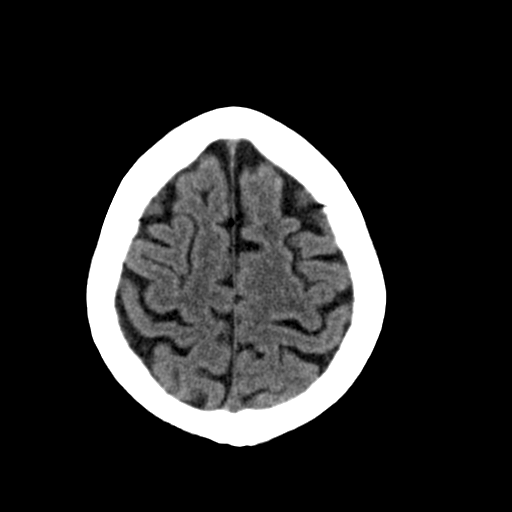
[im 24/32  bone]
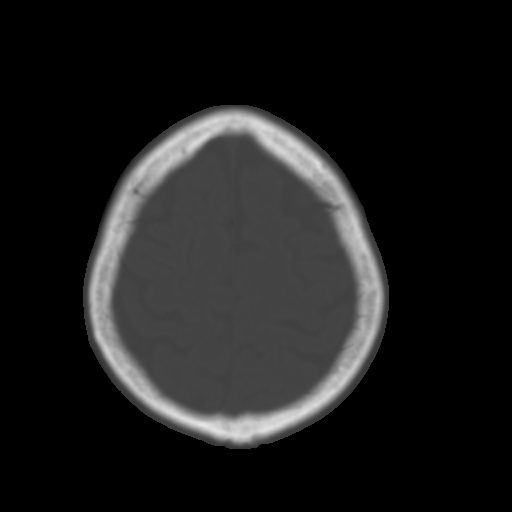
[im 26/32  brain]
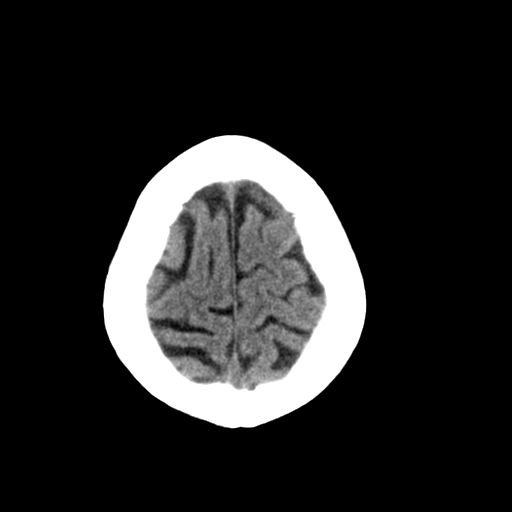
[im 28/32  brain]
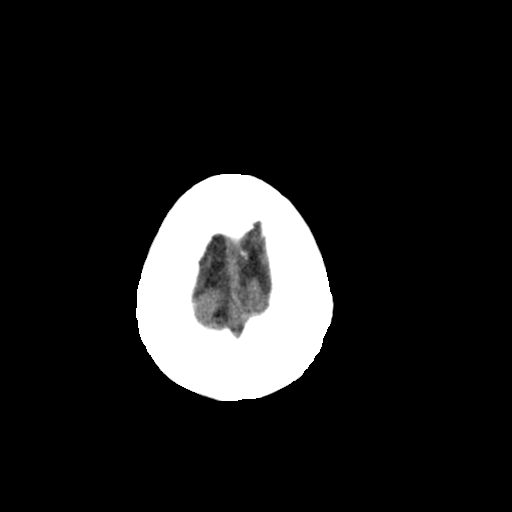
[im 30/32  brain]
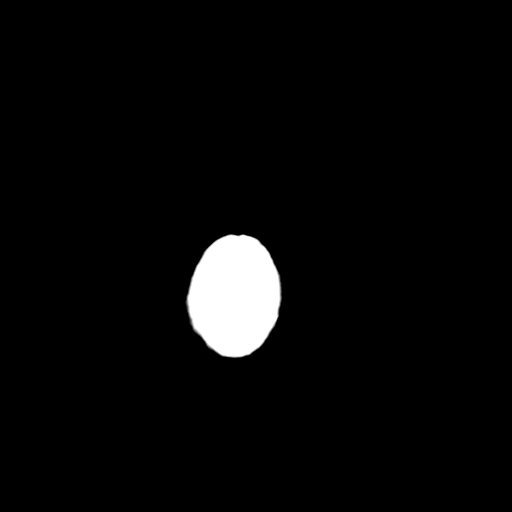

[16 of 30 positions shown; findings below may reference images not displayed]

FINDINGS: Mild cortical volume loss noted with proportional ventricular
prominence. Areas of periventricular white matter hypodensity are
most compatible with small vessel ischemic change. No acute
hemorrhage, infarct, or mass lesion is identified. CSF density
bilateral remote basal ganglial lacunar infarcts are noted. No skull
fracture. Orbits and paranasal sinuses are unremarkable.
IMPRESSION: Chronic findings as above, no acute intracranial abnormality.

## 2016-04-17 IMAGING — CR DG CHEST 1V PORT
1 series · 1 of 1 positions shown · non-contrast
Comparison: Chest radiograph performed 07/09/2015

CLINICAL DATA: Acute onset of shortness of breath. Initial
encounter.

EXAM:
PORTABLE CHEST - 1 VIEW

[ap]
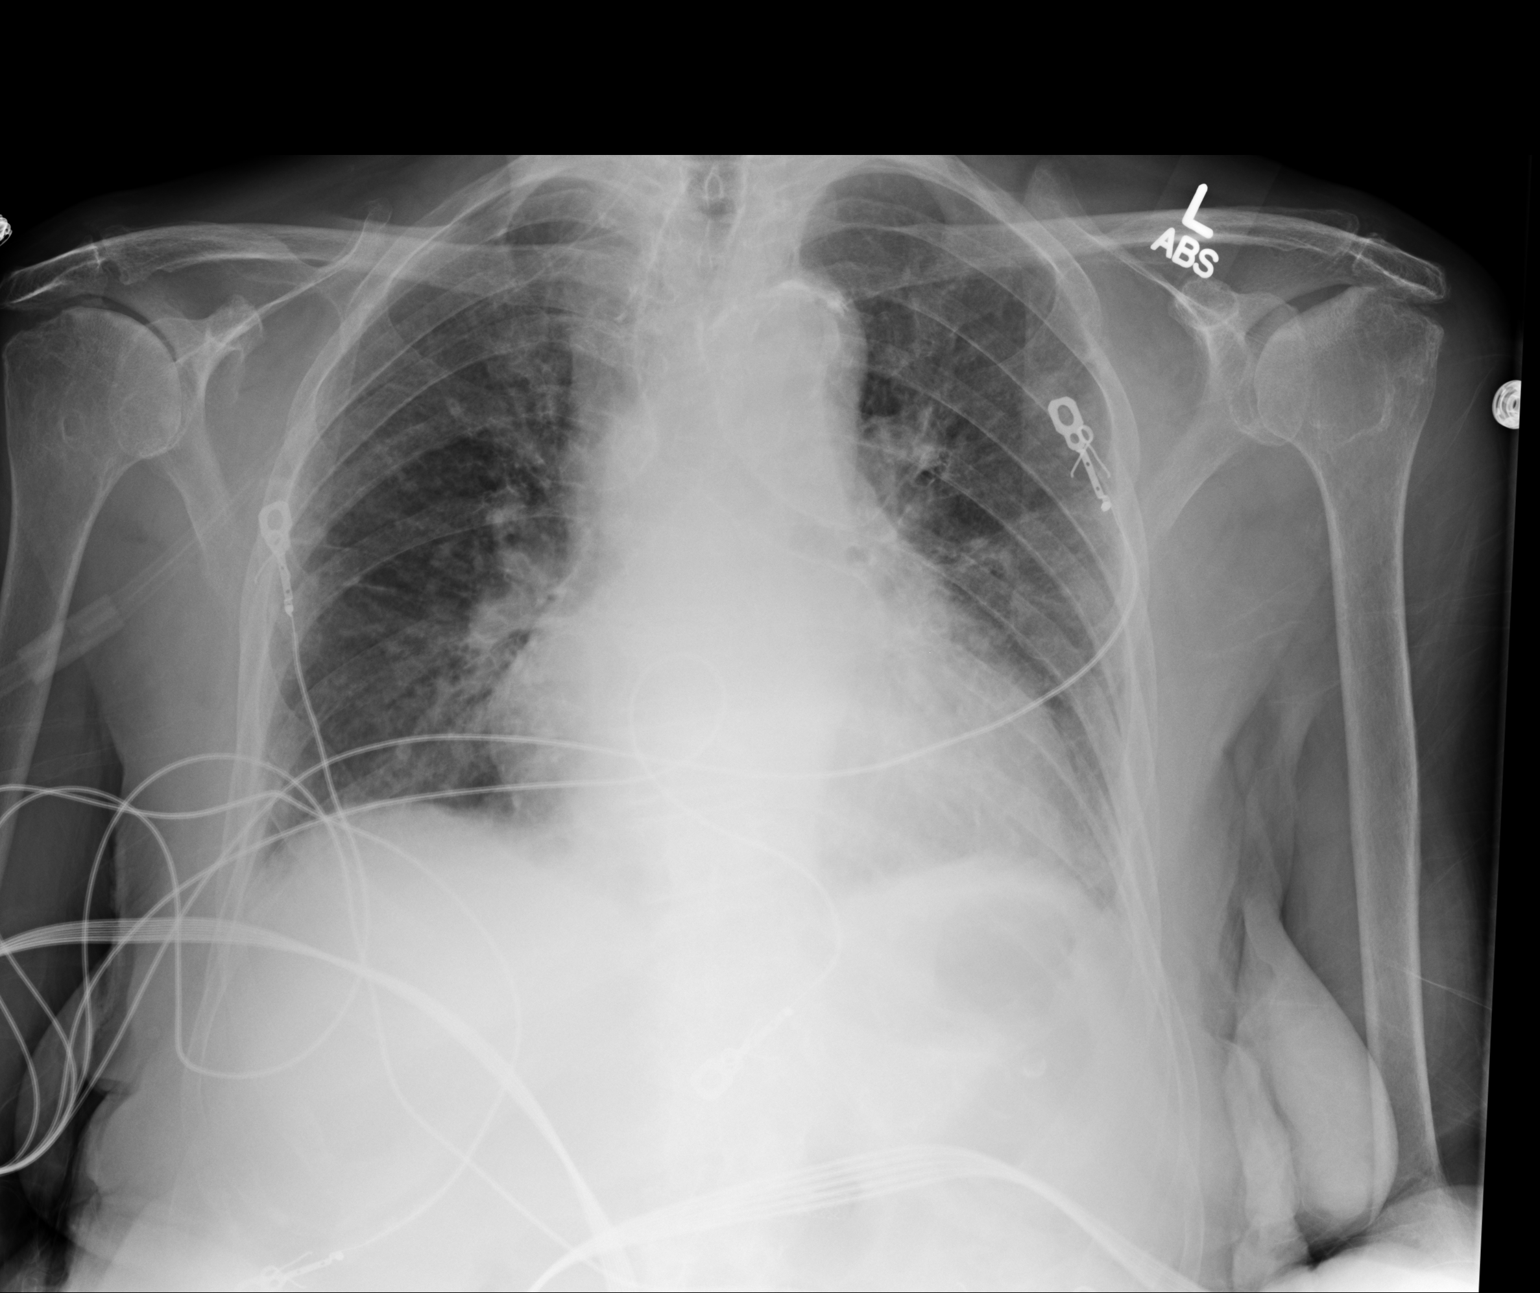

[1 of 1 positions shown; findings below may reference images not displayed]

FINDINGS: The lungs are well-aerated. Vascular congestion is noted.
Peribronchial thickening is seen. Mild bilateral atelectasis is
noted. There is no evidence of pleural effusion or pneumothorax.

The cardiomediastinal silhouette is mildly enlarged. No acute
osseous abnormalities are seen.
IMPRESSION: Vascular congestion and mild cardiomegaly. Peribronchial thickening
seen. Mild bilateral atelectasis noted.
